# Patient Record
Sex: Male | Born: 1980 | Race: Black or African American | Hispanic: Yes | Marital: Single | Smoking: Current every day smoker
Health system: Southern US, Community
[De-identification: ages and names within clinical notes are randomized; demographics above are authoritative.]

## PROBLEM LIST (undated history)

## (undated) DIAGNOSIS — T7840XA Allergy, unspecified, initial encounter: Secondary | ICD-10-CM

## (undated) DIAGNOSIS — K219 Gastro-esophageal reflux disease without esophagitis: Secondary | ICD-10-CM

## (undated) HISTORY — DX: Gastro-esophageal reflux disease without esophagitis: K21.9

## (undated) HISTORY — DX: Allergy, unspecified, initial encounter: T78.40XA

---
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
## 2003-05-12 ENCOUNTER — Emergency Department (HOSPITAL_COMMUNITY): Admission: EM | Admit: 2003-05-12 | Discharge: 2003-05-12 | Payer: Self-pay | Admitting: Emergency Medicine

## 2003-11-09 ENCOUNTER — Emergency Department (HOSPITAL_COMMUNITY): Admission: AD | Admit: 2003-11-09 | Discharge: 2003-11-09 | Payer: Self-pay | Admitting: Internal Medicine

## 2007-03-03 ENCOUNTER — Emergency Department (HOSPITAL_COMMUNITY): Admission: EM | Admit: 2007-03-03 | Discharge: 2007-03-03 | Payer: Self-pay | Admitting: Family Medicine

## 2007-04-26 ENCOUNTER — Telehealth (INDEPENDENT_AMBULATORY_CARE_PROVIDER_SITE_OTHER): Payer: Self-pay | Admitting: *Deleted

## 2007-04-27 ENCOUNTER — Ambulatory Visit: Payer: Self-pay | Admitting: *Deleted

## 2007-04-27 ENCOUNTER — Encounter (INDEPENDENT_AMBULATORY_CARE_PROVIDER_SITE_OTHER): Payer: Self-pay | Admitting: Nurse Practitioner

## 2007-04-27 ENCOUNTER — Ambulatory Visit: Payer: Self-pay | Admitting: Internal Medicine

## 2007-04-27 DIAGNOSIS — K5900 Constipation, unspecified: Secondary | ICD-10-CM | POA: Insufficient documentation

## 2007-04-27 DIAGNOSIS — K219 Gastro-esophageal reflux disease without esophagitis: Secondary | ICD-10-CM

## 2007-04-27 DIAGNOSIS — J309 Allergic rhinitis, unspecified: Secondary | ICD-10-CM

## 2007-04-27 LAB — CONVERTED CEMR LAB
ALT: 11 units/L (ref 0–53)
AST: 17 units/L (ref 0–37)
Albumin: 5 g/dL (ref 3.5–5.2)
Basophils Absolute: 0 10*3/uL (ref 0.0–0.1)
Basophils Relative: 0 % (ref 0–1)
Calcium: 9.7 mg/dL (ref 8.4–10.5)
Chloride: 106 meq/L (ref 96–112)
MCHC: 33.6 g/dL (ref 30.0–36.0)
Monocytes Relative: 10 % (ref 3–11)
Neutro Abs: 2.1 10*3/uL (ref 1.7–7.7)
Neutrophils Relative %: 45 % (ref 43–77)
Potassium: 4.1 meq/L (ref 3.5–5.3)
RBC: 5.38 M/uL (ref 4.22–5.81)
RDW: 13.1 % (ref 11.5–14.0)
Sodium: 141 meq/L (ref 135–145)
Total Protein: 7.1 g/dL (ref 6.0–8.3)

## 2007-04-28 ENCOUNTER — Encounter (INDEPENDENT_AMBULATORY_CARE_PROVIDER_SITE_OTHER): Payer: Self-pay | Admitting: Nurse Practitioner

## 2007-10-09 ENCOUNTER — Encounter (INDEPENDENT_AMBULATORY_CARE_PROVIDER_SITE_OTHER): Payer: Self-pay | Admitting: Nurse Practitioner

## 2008-02-29 ENCOUNTER — Ambulatory Visit: Payer: Self-pay | Admitting: Internal Medicine

## 2008-05-07 ENCOUNTER — Ambulatory Visit: Payer: Self-pay | Admitting: Internal Medicine

## 2008-06-14 ENCOUNTER — Encounter (INDEPENDENT_AMBULATORY_CARE_PROVIDER_SITE_OTHER): Payer: Self-pay | Admitting: *Deleted

## 2008-09-05 ENCOUNTER — Emergency Department (HOSPITAL_COMMUNITY): Admission: EM | Admit: 2008-09-05 | Discharge: 2008-09-05 | Payer: Self-pay | Admitting: Family Medicine

## 2008-09-11 ENCOUNTER — Emergency Department (HOSPITAL_COMMUNITY): Admission: EM | Admit: 2008-09-11 | Discharge: 2008-09-11 | Payer: Self-pay | Admitting: Emergency Medicine

## 2013-08-25 ENCOUNTER — Emergency Department (HOSPITAL_COMMUNITY)
Admission: EM | Admit: 2013-08-25 | Discharge: 2013-08-25 | Disposition: A | Discharge status: Home / Self Care | Payer: Medicaid Other | Source: Home / Self Care | Attending: Emergency Medicine | Admitting: Emergency Medicine

## 2013-08-25 ENCOUNTER — Encounter (HOSPITAL_COMMUNITY): Payer: Self-pay | Admitting: Emergency Medicine

## 2013-08-25 DIAGNOSIS — J069 Acute upper respiratory infection, unspecified: Secondary | ICD-10-CM

## 2013-08-25 MED ORDER — GUAIFENESIN 100 MG/5ML PO LIQD: 100.0000 mg | ORAL | Status: DC | PRN

## 2013-08-25 MED ORDER — IPRATROPIUM BROMIDE 0.03 % NA SOLN: 2.0000 | Freq: Two times a day (BID) | NASAL | Status: DC

## 2013-08-25 NOTE — ED Provider Notes (Signed)
Medical screening examination/treatment/procedure(s) were performed by non-physician practitioner and as supervising physician I was immediately available for consultation/collaboration.  Anival Pasha, M.D.  Jatziri Goffredo C Corena Tilson, MD 08/25/13 2301 

## 2013-08-25 NOTE — ED Notes (Signed)
Pt c/o cold sxs onset yest w/sxs that include: cough, chest d/c/vomiting due to cough, runny nose, congestion Denies: f/v/n/d, SOB, wheezing .... Has not had any meds today He is alert w/no signs of acute distress.

## 2013-08-25 NOTE — ED Provider Notes (Signed)
CSN: [id number]     Arrival date & time 08/25/13  1426 History   First MD Initiated Contact with Patient 08/25/13 1710     Chief Complaint  Patient presents with  . URI   (Consider location/radiation/quality/duration/timing/severity/associated sxs/prior Treatment) Patient is a 32 y.o. male presenting with URI. The history is provided by the patient.  URI Presenting symptoms: congestion, cough and rhinorrhea   Severity:  Mild Onset quality:  Gradual Duration:  1 day Progression:  Unchanged Chronicity:  New Ineffective treatments:  None tried Associated symptoms: no arthralgias, no headaches, no myalgias, no neck pain, no sinus pain, no sneezing, no swollen glands and no wheezing     History reviewed. No pertinent past medical history. History reviewed. No pertinent past surgical history. No family history on file. History  Substance Use Topics  . Smoking status: Current Every Day Smoker -- 0.50 packs/day    Types: Cigarettes  . Smokeless tobacco: Not on file  . Alcohol Use: Not on file    Review of Systems  HENT: Positive for congestion and rhinorrhea. Negative for sneezing.   Respiratory: Positive for cough. Negative for wheezing.   Musculoskeletal: Negative for arthralgias, myalgias and neck pain.  Neurological: Negative for headaches.  All other systems reviewed and are negative.    Allergies  Review of patient's allergies indicates no known allergies.  Home Medications  No current outpatient prescriptions on file. BP 112/67  Pulse 70  Temp(Src) 98.5 F (36.9 C) (Oral)  Resp 18  SpO2 97% Physical Exam  Nursing note and vitals reviewed. Constitutional: He is oriented to person, place, and time. He appears well-developed and well-nourished. No distress.  HENT:  Head: Normocephalic and atraumatic.  Right Ear: Hearing, tympanic membrane, external ear and ear canal normal.  Left Ear: Hearing, tympanic membrane, external ear and ear canal normal.  Nose: Nose  normal.  Mouth/Throat: Uvula is midline, oropharynx is clear and moist and mucous membranes are normal.  Eyes: Conjunctivae are normal. Right eye exhibits no discharge. Left eye exhibits no discharge. No scleral icterus.  Neck: Normal range of motion. Neck supple.  Cardiovascular: Normal rate, regular rhythm and normal heart sounds.   Pulmonary/Chest: Effort normal and breath sounds normal.  Abdominal: Soft. Bowel sounds are normal.  Musculoskeletal: Normal range of motion.  Lymphadenopathy:    He has no cervical adenopathy.  Neurological: He is alert and oriented to person, place, and time.  Skin: Skin is warm and dry.  Psychiatric: He has a normal mood and affect. His behavior is normal.    ED Course  Procedures (including critical care time) Labs Review Labs Reviewed - No data to display Imaging Review No results found.  EKG Interpretation    Date/Time:    Ventricular Rate:    PR Interval:    QRS Duration:   QT Interval:    QTC Calculation:   R Axis:     Text Interpretation:              MDM   Very mild URI. Atrovent nasal spray for congestion and OTC robitussin for cough. PCP follow up prn.    Ardis Rowan, PA 08/25/13 [phone]

## 2013-12-07 ENCOUNTER — Encounter: Payer: Self-pay | Admitting: Family Medicine

## 2013-12-07 ENCOUNTER — Ambulatory Visit (INDEPENDENT_AMBULATORY_CARE_PROVIDER_SITE_OTHER): Payer: Medicaid Other | Admitting: Family Medicine

## 2013-12-07 VITALS — BP 107/66 | HR 61 | Temp 98.2°F | Wt 126.0 lb

## 2013-12-07 DIAGNOSIS — F411 Generalized anxiety disorder: Secondary | ICD-10-CM | POA: Insufficient documentation

## 2013-12-07 DIAGNOSIS — K5900 Constipation, unspecified: Secondary | ICD-10-CM

## 2013-12-07 LAB — CBC
HEMATOCRIT: 42.7 % (ref 39.0–52.0)
HEMOGLOBIN: 14.1 g/dL (ref 13.0–17.0)
MCH: 29 pg (ref 26.0–34.0)
MCHC: 33 g/dL (ref 30.0–36.0)
MCV: 87.7 fL (ref 78.0–100.0)
PLATELETS: 219 10*3/uL (ref 150–400)
RBC: 4.87 MIL/uL (ref 4.22–5.81)
RDW: 13.3 % (ref 11.5–15.5)
WBC: 4.7 10*3/uL (ref 4.0–10.5)

## 2013-12-07 NOTE — Assessment & Plan Note (Signed)
Most likely benign abdominal complaints - Patient concerned given family hx colon CA-Stage IV (Mother age 32)  Plan: 1. CBC - check for anemia, would prompt further testing with FOBT 2. Plan for colonoscopy at age 33 (given significant family hx) 3. Journal bowel habits or abdominal symptoms, discuss more at next apt 4. Inc fiber in diet

## 2013-12-07 NOTE — Patient Instructions (Signed)
Dear Damon Mckee, Thank you for coming in to clinic today. It was good to meet you!  1. I'm encouraged that you scheduled an appointment to be seen in our clinic. 2. Sorry to hear about your family members being diagnosed with cancer, I understand that this is a very stressful time for you. 3. I strongly encourage you to call Dr. Pascal LuxKane [phone]) to schedule an appointment to discuss your emotional / stress concerns, and we will work together to help you. 4. Your symptoms are not concerning for cancer. I understand your concern, and will check your blood count today, and I will mail you a letter with results.  Please schedule a follow-up appointment with me in 1 to 3 months, prefer to see you after you've been seen by Dr. Pascal LuxKane already  If you have any other questions or concerns, please feel free to call the clinic to contact me. You may also schedule an earlier appointment if necessary.  However, if your symptoms get significantly worse, please go to the Emergency Department to seek immediate medical attention.  Saralyn PilarAlexander Karamalegos, DO [facility]
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
## 2013-12-07 NOTE — Assessment & Plan Note (Signed)
Likely multifactorial etiology, likely significant life stressors / traumatic events, recent exacerbation by family member cancer diagnosis. Consider adjustment disorder, however with some chronic hx likely GAD +/- mood disorder  Plan: 1. Discussed plan for multi-facet treatment approach. Agreeable to no rx at this time 2. Refer to Dr. Pascal LuxKane for therapy and evaluation 3. Consider SSRI in future if persistent concomitant depressive features

## 2014-01-03 ENCOUNTER — Telehealth: Payer: Self-pay | Admitting: Family Medicine

## 2014-01-03 NOTE — Telephone Encounter (Signed)
Will FWD to PCP for lab review.  Radene OuKristen L Lindie Roberson, CMA

## 2014-01-03 NOTE — Telephone Encounter (Signed)
Pt called and would like someone to call him with his lab results. He also stated that he didn't receive a letter with the results either. He said that you can leave a message on his phone. jw

## 2014-01-04 NOTE — Telephone Encounter (Signed)
Reviewed patient's chart. Lab result of CBC is (normal, Hemoglobin 14.1 and Platelets 219). I attempted to call the number listed on chart [phone]), and it has an automated machine stating that this number does not accept incoming phone calls.  Could you attempt to call patient again, otherwise, if patient calls back, could you please provide him this information. I had encouraged him to follow-up with return OV in 1 to 3 months. We can discuss his results in more detail at that time. Otherwise, next week when [facility] I can mail a letter with results if we have been unable to contact him.  Thanks!  Saralyn PilarAlexander Kreg Earhart, DO [facility], PGY-1

## 2014-01-04 NOTE — Telephone Encounter (Signed)
Called again, Phone does not accept incoming calls.  Damon Mckee, CMA

## 2014-01-10 ENCOUNTER — Encounter: Payer: Self-pay | Admitting: Family Medicine

## 2014-05-01 ENCOUNTER — Emergency Department (HOSPITAL_COMMUNITY): Payer: Medicaid Other

## 2014-05-01 ENCOUNTER — Emergency Department (HOSPITAL_COMMUNITY)
Admission: EM | Admit: 2014-05-01 | Discharge: 2014-05-01 | Disposition: A | Discharge status: Home / Self Care | Payer: Medicaid Other | Attending: Emergency Medicine | Admitting: Emergency Medicine

## 2014-05-01 ENCOUNTER — Encounter (HOSPITAL_COMMUNITY): Payer: Self-pay | Admitting: Emergency Medicine

## 2014-05-01 DIAGNOSIS — J3489 Other specified disorders of nose and nasal sinuses: Secondary | ICD-10-CM | POA: Diagnosis not present

## 2014-05-01 DIAGNOSIS — F17200 Nicotine dependence, unspecified, uncomplicated: Secondary | ICD-10-CM | POA: Diagnosis not present

## 2014-05-01 DIAGNOSIS — R079 Chest pain, unspecified: Secondary | ICD-10-CM | POA: Insufficient documentation

## 2014-05-01 DIAGNOSIS — Z8719 Personal history of other diseases of the digestive system: Secondary | ICD-10-CM | POA: Diagnosis not present

## 2014-05-01 DIAGNOSIS — R05 Cough: Secondary | ICD-10-CM | POA: Insufficient documentation

## 2014-05-01 DIAGNOSIS — R059 Cough, unspecified: Secondary | ICD-10-CM | POA: Diagnosis not present

## 2014-05-01 DIAGNOSIS — R0789 Other chest pain: Secondary | ICD-10-CM | POA: Insufficient documentation

## 2014-05-01 LAB — CBC
HEMATOCRIT: 43 % (ref 39.0–52.0)
HEMOGLOBIN: 14.9 g/dL (ref 13.0–17.0)
MCH: 30.5 pg (ref 26.0–34.0)
MCHC: 34.7 g/dL (ref 30.0–36.0)
MCV: 88.1 fL (ref 78.0–100.0)
Platelets: 184 10*3/uL (ref 150–400)
RBC: 4.88 MIL/uL (ref 4.22–5.81)
RDW: 12.8 % (ref 11.5–15.5)
WBC: 4 10*3/uL (ref 4.0–10.5)

## 2014-05-01 LAB — BASIC METABOLIC PANEL
Anion gap: 11 (ref 5–15)
BUN: 14 mg/dL (ref 6–23)
CO2: 27 mEq/L (ref 19–32)
CREATININE: 0.84 mg/dL (ref 0.50–1.35)
Calcium: 9.4 mg/dL (ref 8.4–10.5)
Chloride: 107 mEq/L (ref 96–112)
GFR calc Af Amer: >90 mL/min (ref >90)
GLUCOSE: 85 mg/dL (ref 70–99)
POTASSIUM: 3.9 meq/L (ref 3.7–5.3)
Sodium: 145 mEq/L (ref 137–147)

## 2014-05-01 LAB — I-STAT TROPONIN, ED: Troponin i, poc: 0 ng/mL (ref 0.00–0.08)

## 2014-05-01 MED ORDER — NAPROXEN 500 MG PO TABS: 500.0000 mg | ORAL_TABLET | Freq: Two times a day (BID) | ORAL | Status: DC

## 2014-05-01 NOTE — ED Provider Notes (Signed)
CSN: [id number]     Arrival date & time 05/01/14  1417 History   First MD Initiated Contact with Patient 05/01/14 1514     Chief Complaint  Patient presents with  . Chest Pain     (Consider location/radiation/quality/duration/timing/severity/associated sxs/prior Treatment) HPI Comments: Patient is a 33 year old male past medical history significant for GERD, tobacco abuse presenting to the emergency department for 2 days of intermittent episodes of central nonradiating chest sharpness. Each episodes lasts a varying length of time. Alleviating factors: Naproxen. Aggravating factors: stretching, movement, palpation. Medications tried prior to arrival: Naproxen. Patient states he is pain free currently. Endorses associated rhinorrhea and non-productive cough. Denies SOB, nausea, vomiting, abdominal pain. PERC negative. No early familial cardiac history.     Patient is a 33 y.o. male presenting with chest pain.  Chest Pain Associated symptoms: cough   Associated symptoms: no shortness of breath     Past Medical History  Diagnosis Date  . Allergy   . GERD (gastroesophageal reflux disease)    No past surgical history on file. Family History  Problem Relation Age of Onset  . Cancer Mother 32    Colon Cancer (Stage IV)  . Diabetes Mother   . Hypertension Mother   . Cancer Father 68    Prostate Cancer  . Diabetes Father   . Hypertension Father   . Alcohol abuse Father    History  Substance Use Topics  . Smoking status: Current Every Day Smoker -- 0.50 packs/day    Types: Cigarettes  . Smokeless tobacco: Not on file  . Alcohol Use: Yes     Comment: every other week    Review of Systems  HENT: Positive for rhinorrhea.   Respiratory: Positive for cough. Negative for shortness of breath.   Cardiovascular: Positive for chest pain. Negative for leg swelling.  All other systems reviewed and are negative.     Allergies  Review of patient's allergies indicates no known  allergies.  Home Medications   Prior to Admission medications   Medication Sig Start Date End Date Taking? Authorizing Provider  guaiFENesin (ROBITUSSIN) 100 MG/5ML liquid Take 5-10 mLs (100-200 mg total) by mouth every 4 (four) hours as needed for cough. 08/25/13   Mathis Fare Presson, PA  ipratropium (ATROVENT) 0.03 % nasal spray Place 2 sprays into both nostrils every 12 (twelve) hours. 08/25/13   Mathis Fare Presson, PA  naproxen (NAPROSYN) 500 MG tablet Take 1 tablet (500 mg total) by mouth 2 (two) times daily with a meal. 05/01/14   Roux Brandy L Faryal Marxen, PA-C   BP 110/63  Pulse 71  Temp(Src) 98 F (36.7 C) (Oral)  Resp 16  Ht  (1.626 m)  Wt 124 lb (56.246 kg)  BMI 21.27 kg/m2  SpO2 97% Physical Exam  Nursing note and vitals reviewed. Constitutional: He is oriented to person, place, and time. He appears well-developed and well-nourished. No distress.  HENT:  Head: Normocephalic and atraumatic.  Right Ear: External ear normal.  Left Ear: External ear normal.  Nose: Nose normal.  Mouth/Throat: Oropharynx is clear and moist. No oropharyngeal exudate.  Eyes: Conjunctivae are normal.  Neck: Neck supple.  Cardiovascular: Normal rate, regular rhythm and normal heart sounds.   Pulmonary/Chest: Effort normal and breath sounds normal. No respiratory distress. He exhibits no tenderness.  Abdominal: Soft. There is no tenderness.  Musculoskeletal: He exhibits no edema.  Lymphadenopathy:    He has no cervical adenopathy.  Neurological: He is alert and oriented to person,  place, and time.  Skin: Skin is warm and dry. He is not diaphoretic.    ED Course  Procedures (including critical care time) Medications - No data to display  Labs Review Labs Reviewed  CBC  BASIC METABOLIC PANEL  Rosezena Sensor, ED    Imaging Review Dg Chest 2 View  05/01/2014   CLINICAL DATA:  Chest pain  EXAM: CHEST  2 VIEW  COMPARISON:  None.  FINDINGS: Normal heart size and mediastinal  contours. No acute infiltrate or edema. No effusion or pneumothorax. No acute osseous findings.  IMPRESSION: No active cardiopulmonary disease.   Electronically Signed   By: Tiburcio Pea M.D.   On: 05/01/2014 15:05     EKG Interpretation   Date/Time:  Wednesday May 01 2014 14:21:47 EDT Ventricular Rate:  60 PR Interval:  146 QRS Duration: 106 QT Interval:  378 QTC Calculation: 378 R Axis:   178 Text Interpretation:  Normal sinus rhythm Right axis deviation Incomplete  right bundle branch block Right ventricular hypertrophy Abnormal ECG No  old tracing to compare Confirmed by MILLER  MD, BRIAN ([id number]) on 05/01/2014  3:47:18 PM      MDM   Final diagnoses:  Chest pain, atypical    Filed Vitals:   05/01/14 1643  BP:   Pulse: 71  Temp:   Resp: 16   Afebrile, NAD, non-toxic appearing, AAOx4. Patient is to be discharged with recommendation to follow up with PCP in regards to today's hospital visit. Chest pain is not likely of cardiac or pulmonary etiology d/t presentation, perc negative, VSS, no tracheal deviation, no JVD or new murmur, RRR, breath sounds equal bilaterally, EKG without acute abnormalities, negative troponin, and negative CXR. Pt has been advised to return to the ED is CP becomes exertional, associated with diaphoresis or nausea, radiates to left jaw/arm, worsens or becomes concerning in any way. Pt appears reliable for follow up and is agreeable to discharge. Patient is stable at time of discharge.       Jeannetta Ellis, PA-C 05/01/14 1917

## 2014-05-01 NOTE — ED Notes (Signed)
Pt reports 7/10 intermittent central chest "sharpness" x 2 days. Denies SOB, N/V or diaphoresis. Pt in NAD. AO x4.

## 2014-05-01 NOTE — Discharge Instructions (Signed)
Please follow up with your primary care physician in 1-2 days. If you do not have one please call the [facility] number listed above. Please take Naproxen as prescribed. Please read all discharge instructions and return precautions.    Chest Pain (Nonspecific) It is often hard to give a specific diagnosis for the cause of chest pain. There is always a chance that your pain could be related to something serious, such as a heart attack or a blood clot in the lungs. You need to follow up with your health care provider for further evaluation. CAUSES   Heartburn.  Pneumonia or bronchitis.  Anxiety or stress.  Inflammation around your heart (pericarditis) or lung (pleuritis or pleurisy).  A blood clot in the lung.  A collapsed lung (pneumothorax). It can develop suddenly on its own (spontaneous pneumothorax) or from trauma to the chest.  Shingles infection (herpes zoster virus). The chest wall is composed of bones, muscles, and cartilage. Any of these can be the source of the pain.  The bones can be bruised by injury.  The muscles or cartilage can be strained by coughing or overwork.  The cartilage can be affected by inflammation and become sore (costochondritis). DIAGNOSIS  Lab tests or other studies may be needed to find the cause of your pain. Your health care provider may have you take a test called an ambulatory electrocardiogram (ECG). An ECG records your heartbeat patterns over a 24-hour period. You may also have other tests, such as:  Transthoracic echocardiogram (TTE). During echocardiography, sound waves are used to evaluate how blood flows through your heart.  Transesophageal echocardiogram (TEE).  Cardiac monitoring. This allows your health care provider to monitor your heart rate and rhythm in real time.  Holter monitor. This is a portable device that records your heartbeat and can help diagnose heart arrhythmias. It allows your health care provider to  track your heart activity for several days, if needed.  Stress tests by exercise or by giving medicine that makes the heart beat faster. TREATMENT   Treatment depends on what may be causing your chest pain. Treatment may include:  Acid blockers for heartburn.  Anti-inflammatory medicine.  Pain medicine for inflammatory conditions.  Antibiotics if an infection is present.  You may be advised to change lifestyle habits. This includes stopping smoking and avoiding alcohol, caffeine, and chocolate.  You may be advised to keep your head raised (elevated) when sleeping. This reduces the chance of acid going backward from your stomach into your esophagus. Most of the time, nonspecific chest pain will improve within 2-3 days with rest and mild pain medicine.  HOME CARE INSTRUCTIONS   If antibiotics were prescribed, take them as directed. Finish them even if you start to feel better.  For the next few days, avoid physical activities that bring on chest pain. Continue physical activities as directed.  Do not use any tobacco products, including cigarettes, chewing tobacco, or electronic cigarettes.  Avoid drinking alcohol.  Only take medicine as directed by your health care provider.  Follow your health care provider's suggestions for further testing if your chest pain does not go away.  Keep any follow-up appointments you made. If you do not go to an appointment, you could develop lasting (chronic) problems with pain. If there is any problem keeping an appointment, call to reschedule. SEEK MEDICAL CARE IF:   Your chest pain does not go away, even after treatment.  You have a rash with blisters on your chest.  You have a fever. SEEK IMMEDIATE MEDICAL CARE IF:   You have increased chest pain or pain that spreads to your arm, neck, jaw, back, or abdomen.  You have shortness of breath.  You have an increasing cough, or you cough up blood.  You have severe back or abdominal  pain.  You feel nauseous or vomit.  You have severe weakness.  You faint.  You have chills. This is an emergency. Do not wait to see if the pain will go away. Get medical help at once. Call your local emergency services (911 in [place] Do not drive yourself to the hospital. MAKE SURE YOU:   Understand these instructions.  Will watch your condition.  Will get help right away if you are not doing well or get worse. Document Released: 05/26/2005 Document Revised: 08/21/2013 Document Reviewed: 03/21/2008 [facility] Patient Information 2015 [place], [place]. This information is not intended to replace advice given to you by your health care provider. Make sure you discuss any questions you have with your health care provider.

## 2014-05-02 NOTE — ED Provider Notes (Signed)
Medical screening examination/treatment/procedure(s) were performed by non-physician practitioner and as supervising physician I was immediately available for consultation/collaboration.    Vida Roller, MD 05/02/14 [phone]

## 2014-11-04 ENCOUNTER — Encounter: Payer: Medicaid Other | Admitting: Family Medicine

## 2014-12-03 ENCOUNTER — Encounter: Payer: Self-pay | Admitting: Family Medicine

## 2014-12-03 ENCOUNTER — Ambulatory Visit (INDEPENDENT_AMBULATORY_CARE_PROVIDER_SITE_OTHER): Payer: Medicaid Other | Admitting: Family Medicine

## 2014-12-03 VITALS — BP 134/79 | HR 71 | Temp 98.2°F | Ht 64.0 in | Wt 135.0 lb

## 2014-12-03 DIAGNOSIS — Z720 Tobacco use: Secondary | ICD-10-CM

## 2014-12-03 DIAGNOSIS — Z23 Encounter for immunization: Secondary | ICD-10-CM | POA: Diagnosis not present

## 2014-12-03 DIAGNOSIS — J309 Allergic rhinitis, unspecified: Secondary | ICD-10-CM

## 2014-12-03 DIAGNOSIS — Z13220 Encounter for screening for lipoid disorders: Secondary | ICD-10-CM | POA: Diagnosis not present

## 2014-12-03 DIAGNOSIS — Z114 Encounter for screening for human immunodeficiency virus [HIV]: Secondary | ICD-10-CM

## 2014-12-03 DIAGNOSIS — F411 Generalized anxiety disorder: Secondary | ICD-10-CM | POA: Diagnosis present

## 2014-12-03 DIAGNOSIS — Z0000 Encounter for general adult medical examination without abnormal findings: Secondary | ICD-10-CM

## 2014-12-03 DIAGNOSIS — Z113 Encounter for screening for infections with a predominantly sexual mode of transmission: Secondary | ICD-10-CM | POA: Insufficient documentation

## 2014-12-03 NOTE — Assessment & Plan Note (Addendum)
Stable, without improvement or worsening. Suspect multifactorial with stressors, possible underlying mood disorder. Concerns with friend/family member diagnosis with cancer - PHQ-9: score 6, functional, remains employed, engaged to fiance - Previously not interested in psych meds / counseling, now may be interested - Occasional EtOH, chronic tobacco, denies other substances - No SI / HI  Plan: 1. [facility] / therapy, not interested in Psychiatry at this time. Preference to avoid medical therapy 2. Encourage healthy lifestyle, diet, exercise, sleep hygiene to improve mood, reduce stress 3. Reassurance regarding concerns about cancer dx - exam reassuring, no symptoms, emphasized plan for early colon CA screening age 34, and would recommend early prostate screening between age 33-50 as well 4. Smoking cessation 5. RTC PRN

## 2014-12-03 NOTE — Assessment & Plan Note (Signed)
Chronic active smoker - Not ready to quit  Plan: 1. Smoking cessation counseling provided

## 2014-12-03 NOTE — Assessment & Plan Note (Signed)
-   B/l allergic shiners and some allergic symptoms - Start Claritin OTC 10mg  daily trial

## 2014-12-03 NOTE — Assessment & Plan Note (Signed)
-   Ordered future fasting lipid panel, HIV screening - TDap given today - CA Screening, provided reassurance regarding concerns about cancer dx - exam reassuring, no symptoms, emphasized plan for early colon CA screening age 34, and would recommend early prostate screening between age 33-50 as well

## 2014-12-03 NOTE — Progress Notes (Signed)
   Subjective:    Patient ID: Damon Mckee, male    DOB: 11/24/1980, 34 y.o.   MRN: [id number]  Patient presents for annual physical exam.  HPI  ANXIETY / MOOD DISORDER: - Chronic h/o anxiety with multiple life stressors - Today reports overall mood is "decent", has some symptoms with "feeling down" or "little interest" not everyday, and seems to come and go based on current stressors. Reports he is "not very social" and has a hard time getting along with some people. Continues to function at his same job in [employer]. Remains engaged to fiance. - Initial PHQ-2 positive, PHQ-9 testing score 6 (see documentation in Epic) - No prior diagnosis of depression. No prior psychiatric medications. - Previously referred to Dr. Pascal [facility]Kane at [facility], however he did not follow-up and schedule appointment, did not think he needed counseling last year. Now states he may be interested in this option, but is not interested in medicines for mood or anxiety - Additionally, admits to concerns with friend diagnosed with "rare form of cancer", states parents dx with cancer as well (Fam hx mother colon CA, father prostate CA), he is concerned about cancer and other medical diagnoses. - Admits to some chronic decreased energy and motivation, poor sleep - Denies suicidal or homicidal ideation - Denies any anger concerns or emotional lability, hyperactivity, inappropriate behavior, fevers/chills, unintentional weight loss, nightsweats  RIGHT FOREARM / THIGH SORENESS: - Reports that he often gets intermittent episodes of muscle soreness mostly in Right forearm, and sometimes in Right lower thigh behind his knee. 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 No specific injuries or trauma. - Does not take any medicines (Tylenol, NSAIDs), no topical  therapy - Denies any numbness, weakness, tingling, swelling  TOBACCO ABUSE: - Active smoker, chronic 0.5 ppd > 20 years - Previously quit briefly. Not currently ready to quit. States he would be able to if he wanted. - No medicines or NRT tried  I have reviewed and updated the following as appropriate: allergies and current medications  Social Hx: - Lives with fiance, with her 2 daughters and their 1 son - Current smoker 1/2 ppd (since 1994) - Occasional EtOH every other week (3-4 drinks a time)  Review of Systems  See above HPI    Objective:   Physical Exam  BP 134/79 mmHg  Pulse 71  Temp(Src) 98.2 F (36.8 C) (Oral)  Ht 5\' 4"  (1.626 m)  Wt 135 lb (61.236 kg)  BMI 23.16 kg/m2  Gen - thin, well-developed, well-appearing, cooperative, NAD HEENT - NCAT, PERRL, allergic shiners with mild puffiness under each eye, b/l nares patent, MMM Neck - supple, non-tender, no LAD or thyromegaly Heart - RRR, no murmurs heard Lungs - CTAB, no wheezing, crackles, or rhonchi. Normal work of breathing. Skin - warm, dry, no rashes Neuro - awake, alert, grossly non-focal, intact muscle strength 5/5 b/l grip, hip flex and ext, knee flex, gait normal Psych - mild blunted affect, normal thought content and speech, appropriate behavior, good eye contact     Assessment & Plan:   See specific A&P problem list for details.

## 2014-12-03 NOTE — Assessment & Plan Note (Signed)
Return for scheduled Lab Only visit, screening fasting lipid panel

## 2014-12-03 NOTE — Patient Instructions (Signed)
Dear Damon Mckee, Thank you for coming in to clinic today. It was good to meet you!  1. For your muscle soreness - it sounds like muscle strain from lifting at work. If worsens or flare can use anti-inflammatory (Ibuprofen or Aleve), topical icy-hot, stretching helps prevent. 2. For Anxiety / Stress - referral to [facility] for counseling / therapy - they will call you with an appointment. Please call us back if you don't hear anything over next 1-2 months 3. Tetanus shot today  Please schedule "Lab Only" Appointment (8 or 9 am) to get Fasting blood work (don't eat after midnight night before, no eating / drinking in morning), check Cholesterol and HIV  Please schedule a follow-up appointment with me as needed to discuss Anxiety / Stress, otherwise return in 1 year for physical.  If you have any other questions or concerns, please feel free to call the clinic to contact me. You may also schedule an earlier appointment if necessary.  However, if your symptoms get significantly worse, please go to the Emergency Department to seek immediate medical attention.  Saralyn PilarAlexander Ryer Asato, DO [facility]

## 2014-12-10 ENCOUNTER — Other Ambulatory Visit: Payer: Medicaid Other

## 2014-12-11 NOTE — Progress Notes (Signed)
I was the preceptor for this visit. 

## 2014-12-13 ENCOUNTER — Ambulatory Visit: Payer: Medicaid Other | Admitting: Family Medicine

## 2015-09-26 ENCOUNTER — Other Ambulatory Visit (HOSPITAL_COMMUNITY)
Admission: RE | Admit: 2015-09-26 | Discharge: 2015-09-26 | Disposition: A | Discharge status: Home / Self Care | Payer: Medicaid Other | Source: Ambulatory Visit | Attending: Family Medicine | Admitting: Family Medicine

## 2015-09-26 ENCOUNTER — Ambulatory Visit (INDEPENDENT_AMBULATORY_CARE_PROVIDER_SITE_OTHER): Payer: Medicaid Other | Admitting: Family Medicine

## 2015-09-26 VITALS — BP 135/84 | HR 58 | Temp 97.7°F | Wt 158.5 lb

## 2015-09-26 DIAGNOSIS — Z113 Encounter for screening for infections with a predominantly sexual mode of transmission: Secondary | ICD-10-CM | POA: Insufficient documentation

## 2015-09-26 DIAGNOSIS — Z202 Contact with and (suspected) exposure to infections with a predominantly sexual mode of transmission: Secondary | ICD-10-CM | POA: Insufficient documentation

## 2015-09-26 DIAGNOSIS — K219 Gastro-esophageal reflux disease without esophagitis: Secondary | ICD-10-CM

## 2015-09-26 MED ORDER — METRONIDAZOLE 250 MG PO TABS
2000.0000 mg | ORAL_TABLET | Freq: Once | ORAL | Status: AC
  Administered 2015-09-26: 2000 mg via ORAL

## 2015-09-26 MED ORDER — RANITIDINE HCL 150 MG PO TABS: 150.0000 mg | ORAL_TABLET | Freq: Two times a day (BID) | ORAL | Status: DC

## 2015-09-26 NOTE — Assessment & Plan Note (Signed)
Asymptomatic today. Irritation in groin likely related to skin fold rubbing against each other - no active lesions to culture. Will treat with  of flagyl today for trichomonas exposure. Will check HIV, RPR, and HSV antibodies. Will check urine for GC and chlamydia. Safe sex practices discussed. Follow up as needed.

## 2015-09-26 NOTE — Patient Instructions (Signed)
We will treat you for trichomoniasis today. Please do not have intercourse for 7 days after taking this medication.  We will check your blood for HIV, syphilis, and herpes. We should have results next week.  We will check your urine for gonorrhea and chlamydia. We should have these results next week.  We will give you acid reflux medication. If you are still having issues, please come back to see your regular doctor.  Take care,  Dr Jimmey Ralph

## 2015-09-26 NOTE — Addendum Note (Signed)
Addended by: Jennette Bill on: 09/26/2015 12:09 PM   Modules accepted: Orders

## 2015-09-26 NOTE — Assessment & Plan Note (Signed)
Will restart ranitidine today.

## 2015-09-26 NOTE — Progress Notes (Signed)
    Subjective:  Damon Mckee is a 35 y.o. male who presents to the [facility] today for same day appointment with a chief complaint of STD exposure.   HPI:  STD Exposure Patient reports that his current partner was diagnosed with trichomonas and herpes a few days ago. He has not currently received any treatment. Last sexually active with that partner about a week ago. No other partners. Patient has noticed increased raw skin in his groin, but no open sores. No dysuria or penile discharge. No fevers or chills. Took some penicillin he had left over at home this morning.   GERD Patient also complains of GERD symptoms. Was previously on a medication that helped, but does not remember the name. No nausea or vomiting. No weight loss.   ROS: Per HPI  PMH:  The following were reviewed and entered/updated in epic: Past Medical History  Diagnosis Date  . Allergy   . GERD (gastroesophageal reflux disease)    Patient Active Problem List   Diagnosis Date Noted  . STD exposure 09/26/2015  . Tobacco abuse 12/03/2014  . Screening for cholesterol level 12/03/2014  . Screening for HIV without presence of risk factors 12/03/2014  . Well adult exam 12/03/2014  . Anxiety state 12/07/2013  . Allergic rhinitis 04/27/2007  . GERD 04/27/2007  . CONSTIPATION 04/27/2007   No past surgical history on file.   Objective:  Physical Exam: BP 135/84 mmHg  Pulse 58  Temp(Src) 97.7 F (36.5 C) (Oral)  Wt 158 lb 8 oz (71.895 kg)  Gen: NAD, resting comfortably CV: RRR with no murmurs appreciated Pulm: NWOB, CTAB with no crackles, wheezes, or rhonchi GI: Normal bowel sounds present. Soft, Nontender, Nondistended. GU: Normal male genitalia. No open lesions. Inguinal area with slight erythema but no vesicles or areas of skin breakdown.  Skin: warm, dry  Assessment/Plan:  STD exposure Asymptomatic today. Irritation in groin likely related to skin fold rubbing against each other - no active lesions to  culture. Will treat with  of flagyl today for trichomonas exposure. Will check HIV, RPR, and HSV antibodies. Will check urine for GC and chlamydia. Safe sex practices discussed. Follow up as needed.   GERD Will restart ranitidine today.   Katina Degree. Jimmey Ralph, MD [facility] Resident PGY-2 09/26/2015 11:49 AM

## 2015-09-27 LAB — RPR

## 2015-09-27 LAB — HIV ANTIBODY (ROUTINE TESTING W REFLEX): HIV 1&2 Ab, 4th Generation: NONREACTIVE

## 2015-09-29 ENCOUNTER — Encounter: Payer: Self-pay | Admitting: Family Medicine

## 2015-09-29 LAB — URINE CYTOLOGY ANCILLARY ONLY
CHLAMYDIA, DNA PROBE: NEGATIVE
NEISSERIA GONORRHEA: NEGATIVE

## 2015-09-29 LAB — HSV 2 ANTIBODY, IGG

## 2015-09-29 LAB — HSV 1 ANTIBODY, IGG: HSV 1 GLYCOPROTEIN G AB, IGG: 13.06 IV — AB

## 2015-09-30 ENCOUNTER — Telehealth: Payer: Self-pay | Admitting: Family Medicine

## 2015-09-30 NOTE — Telephone Encounter (Signed)
Would like results

## 2015-10-01 NOTE — Telephone Encounter (Signed)
Pt is calling to get his lab results. jw

## 2015-10-01 NOTE — Telephone Encounter (Signed)
Patient last seen by Dr Jimmey Ralph on 09/26/15, will forward message to him to follow-up with patient on his STD test results.  Saralyn Pilar, DO [facility], PGY-3

## 2015-10-01 NOTE — Telephone Encounter (Signed)
Called patient to discuss results. 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 Instructed him to follow up with [place] if he has any genital symptoms.   Katina Degree. Jimmey Ralph, MD [facility] Resident PGY-2 10/01/2015 11:59 AM

## 2015-10-06 ENCOUNTER — Telehealth: Payer: Self-pay | Admitting: Family Medicine

## 2015-10-06 ENCOUNTER — Emergency Department (INDEPENDENT_AMBULATORY_CARE_PROVIDER_SITE_OTHER)
Admission: EM | Admit: 2015-10-06 | Discharge: 2015-10-06 | Disposition: A | Discharge status: Home / Self Care | Payer: Self-pay | Source: Home / Self Care | Attending: Family Medicine | Admitting: Family Medicine

## 2015-10-06 ENCOUNTER — Emergency Department (INDEPENDENT_AMBULATORY_CARE_PROVIDER_SITE_OTHER): Payer: Self-pay

## 2015-10-06 ENCOUNTER — Encounter (HOSPITAL_COMMUNITY): Payer: Self-pay | Admitting: Emergency Medicine

## 2015-10-06 DIAGNOSIS — M940 Chondrocostal junction syndrome [Tietze]: Secondary | ICD-10-CM

## 2015-10-06 MED ORDER — NAPROXEN 500 MG PO TABS: 500.0000 mg | ORAL_TABLET | Freq: Two times a day (BID) | ORAL | Status: DC

## 2015-10-06 NOTE — ED Provider Notes (Signed)
CSN: [id number]     Arrival date & time 10/06/15  1304 History   First MD Initiated Contact with Patient 10/06/15 1331     Chief Complaint  Patient presents with  . Cough  . Diarrhea  . Pleurisy   (Consider location/radiation/quality/duration/timing/severity/associated sxs/prior Treatment) HPI History obtained from patient:   LOCATION:chest right side SEVERITY: DURATION: 5 days CONTEXT: recent URI QUALITY: MODIFYING FACTORS: OTC meds without relief ASSOCIATED SYMPTOMS:cough with sputum green TIMING: episodic OCCUPATION: painter  Past Medical History  Diagnosis Date  . Allergy   . GERD (gastroesophageal reflux disease)    History reviewed. No pertinent past surgical history. Family History  Problem Relation Age of Onset  . Cancer Mother 72    Colon Cancer (Stage IV)  . Diabetes Mother   . Hypertension Mother   . Cancer Father 69    Prostate Cancer  . Diabetes Father   . Hypertension Father   . Alcohol abuse Father    Social History  Substance Use Topics  . Smoking status: Current Every Day Smoker -- 0.50 packs/day    Types: Cigarettes  . Smokeless tobacco: None  . Alcohol Use: Yes     Comment: every other week    Review of Systems ROS +'ve reperoducible right sided chest pain  Denies: HEADACHE, NAUSEA, ABDOMINAL PAIN, CHEST PAIN, CONGESTION, DYSURIA, SHORTNESS OF BREATH  Allergies  Review of patient's allergies indicates no known allergies.  Home Medications   Prior to Admission medications   Medication Sig Start Date End Date Taking? Authorizing Provider  ranitidine (ZANTAC) 150 MG tablet Take 1 tablet (150 mg total) by mouth 2 (two) times daily. 09/26/15   Ardith Dark, MD   Meds Ordered and Administered this Visit  Medications - No data to display  BP 138/89 mmHg  Pulse 59  Temp(Src) 98.6 F (37 C) (Oral)  Resp 12  SpO2 100% No data found.   Physical Exam  Constitutional: He appears well-developed and well-nourished.  Cardiovascular:  Normal rate.   Pulmonary/Chest: Effort normal and breath sounds normal. He exhibits tenderness. Right breast exhibits tenderness.    Nursing note and vitals reviewed.   ED Course  Procedures (including critical care time)  Labs Review Labs Reviewed - No data to display  Imaging Review Dg Chest 2 View  10/06/2015  CLINICAL DATA:  Right-sided chest pain with cough for 1 week. Smoker. EXAM: CHEST  2 VIEW COMPARISON:  Radiographs 05/01/2014. FINDINGS: The heart size and mediastinal contours are normal. The lungs are clear. There is no pleural effusion or pneumothorax. No acute osseous findings are identified. IMPRESSION: Stable chest.  No active cardiopulmonary process. Electronically Signed   By: Carey Bullocks M.D.   On: 10/06/2015 14:20     Visual Acuity Review  Right Eye Distance:   Left Eye Distance:   Bilateral Distance:    Right Eye Near:   Left Eye Near:    Bilateral Near:         MDM   1. Costochondritis, acute    Patient is advised to continue home symptomatic treatment. Prescription for naproxen sent pharmacy patient has indicated. Patient is advised that if there are new or worsening symptoms or attend the emergency department, or contact primary care provider. Instructions of care provided discharged home in stable condition. Return to work/school note provided.  THIS NOTE WAS GENERATED USING A VOICE RECOGNITION SOFTWARE PROGRAM. ALL REASONABLE EFFORTS  WERE MADE TO PROOFREAD THIS DOCUMENT FOR ACCURACY.     Tharon Aquas,  PA 10/06/15 1450

## 2015-10-06 NOTE — Discharge Instructions (Signed)
Costochondritis Costochondritis is a condition in which the tissue (cartilage) that connects your ribs with your breastbone (sternum) becomes irritated. It causes pain in the chest and rib area. It usually goes away on its own over time. HOME CARE  Avoid activities that wear you out.  Do not strain your ribs. Avoid activities that use your:  Chest.  Belly.  Side muscles.  Put ice on the area for the first 2 days after the pain starts.  Put ice in a plastic bag.  Place a towel between your skin and the bag.  Leave the ice on for 20 minutes, 2-3 times a day.  Only take medicine as told by your doctor. GET HELP IF:  You have redness or puffiness (swelling) in the rib area.  Your pain does not go away with rest or medicine. GET HELP RIGHT AWAY IF:   Your pain gets worse.  You are very uncomfortable.  You have trouble breathing.  You cough up blood.  You start sweating or throwing up (vomiting).  You have a fever or lasting symptoms for more than 2-3 days.  You have a fever and your symptoms suddenly get worse. MAKE SURE YOU:   Understand these instructions.  Will watch your condition.  Will get help right away if you are not doing well or get worse.   This information is not intended to replace advice given to you by your health care provider. Make sure you discuss any questions you have with your health care provider.   Document Released: 02/02/2008 Document Revised: 04/18/2013 Document Reviewed: 03/20/2013 Elsevier Interactive Patient Education 2016 Elsevier Inc.  Chest Wall Pain Chest wall pain is pain in or around the bones and muscles of your chest. Sometimes, an injury causes this pain. Sometimes, the cause may not be known. This pain may take several weeks or longer to get better. HOME CARE Pay attention to any changes in your symptoms. Take these actions to help with your pain:  Rest as told by your doctor.  Avoid activities that cause pain. Try not  to use your chest, belly (abdominal), or side muscles to lift heavy things.  If directed, apply ice to the painful area:  Put ice in a plastic bag.  Place a towel between your skin and the bag.  Leave the ice on for 20 minutes, 2-3 times per day.  Take over-the-counter and prescription medicines only as told by your doctor.  Do not use tobacco products, including cigarettes, chewing tobacco, and e-cigarettes. If you need help quitting, ask your doctor.  Keep all follow-up visits as told by your doctor. This is important. GET HELP IF:  You have a fever.  Your chest pain gets worse.  You have new symptoms. GET HELP RIGHT AWAY IF:  You feel sick to your stomach (nauseous) or you throw up (vomit).  You feel sweaty or light-headed.  You have a cough with phlegm (sputum) or you cough up blood.  You are short of breath.   This information is not intended to replace advice given to you by your health care provider. Make sure you discuss any questions you have with your health care provider.   Document Released: 02/02/2008 Document Revised: 05/07/2015 Document Reviewed: 11/11/2014 Elsevier Interactive Patient Education [employer]

## 2015-10-06 NOTE — Telephone Encounter (Signed)
Returned patient's called. Patient asked about positive HSV-1 result. Again told patient that this is the virus that causes cold sores and in some cases can cause genital symptoms. Patient has not had any genital symptoms, though does have a history of cold sores. Patient voiced understanding and had no further symptoms.  Katina Degree. Jimmey Ralph, MD [facility] Resident PGY-2 10/06/2015 4:11 PM

## 2015-10-06 NOTE — Telephone Encounter (Signed)
Seen Dr. Jimmey Ralph on 09/26/15 and would like more clarification on lab results (HSV 1) . Please advise.

## 2015-10-06 NOTE — Telephone Encounter (Signed)
Will forward to MD to clarify results.  Jazmin Hartsell,CMA

## 2015-10-06 NOTE — ED Notes (Signed)
The patient presented to the [facility] with a complaint of a cough, chest wall pain and a slight amount of diarrhea for 5 days.

## 2015-10-08 ENCOUNTER — Encounter (HOSPITAL_COMMUNITY): Payer: Self-pay | Admitting: Emergency Medicine

## 2015-10-08 ENCOUNTER — Emergency Department (HOSPITAL_COMMUNITY)
Admission: EM | Admit: 2015-10-08 | Discharge: 2015-10-08 | Disposition: A | Discharge status: Home / Self Care | Payer: Self-pay | Attending: Emergency Medicine | Admitting: Emergency Medicine

## 2015-10-08 ENCOUNTER — Emergency Department (HOSPITAL_COMMUNITY): Payer: Self-pay

## 2015-10-08 DIAGNOSIS — K219 Gastro-esophageal reflux disease without esophagitis: Secondary | ICD-10-CM | POA: Insufficient documentation

## 2015-10-08 DIAGNOSIS — M940 Chondrocostal junction syndrome [Tietze]: Secondary | ICD-10-CM | POA: Insufficient documentation

## 2015-10-08 DIAGNOSIS — Z791 Long term (current) use of non-steroidal anti-inflammatories (NSAID): Secondary | ICD-10-CM | POA: Insufficient documentation

## 2015-10-08 DIAGNOSIS — R079 Chest pain, unspecified: Secondary | ICD-10-CM

## 2015-10-08 DIAGNOSIS — M791 Myalgia: Secondary | ICD-10-CM | POA: Insufficient documentation

## 2015-10-08 DIAGNOSIS — R197 Diarrhea, unspecified: Secondary | ICD-10-CM | POA: Insufficient documentation

## 2015-10-08 DIAGNOSIS — F17210 Nicotine dependence, cigarettes, uncomplicated: Secondary | ICD-10-CM | POA: Insufficient documentation

## 2015-10-08 DIAGNOSIS — R05 Cough: Secondary | ICD-10-CM | POA: Insufficient documentation

## 2015-10-08 DIAGNOSIS — Z79899 Other long term (current) drug therapy: Secondary | ICD-10-CM | POA: Insufficient documentation

## 2015-10-08 MED ORDER — METHOCARBAMOL 500 MG PO TABS: 500.0000 mg | ORAL_TABLET | Freq: Two times a day (BID) | ORAL | Status: DC

## 2015-10-08 NOTE — Discharge Instructions (Signed)

## 2015-10-08 NOTE — ED Notes (Signed)
Pt given rolled up towel and taught how to splint area on ribs that is painful. Pt stated that he felt that this helped to decrease pain when coughing.

## 2015-10-08 NOTE — ED Notes (Signed)
Bowie PA at bedside 

## 2015-10-08 NOTE — ED Provider Notes (Signed)
CSN: [id number]     Arrival date & time 10/08/15  1950 History  By signing my name below, I, Murriel Hopper, attest that this documentation has been prepared under the direction and in the presence of Fayrene Helper, PA-C.  Electronically Signed: Murriel Hopper, ED Scribe. 10/08/2015. 10:42 PM.   Chief Complaint  Patient presents with  . Muscle Pain    The patient has been diagnosed with Costocondritis on Monday.  He went to [facility] and they diagnosed him.  He says he is not getting better and was told to follow up with [place].       Patient is a 35 y.o. male presenting with musculoskeletal pain. The history is provided by the patient. No language interpreter was used.  Muscle Pain   HPI Comments: Damon Mckee is a 35 y.o. male who presents to the Emergency Department complaining of constant, worsening 10/10 muscle pain to the right side of his ribcage. Pt states he was seen at urgent care three days ago and diagnosed with Costochondritis. Pt received Naproxen but states he has not been taking it consistently, and reports he has had no relief with it. Pt has also had a productive cough with yellow sputum in addition to body aches that has been present for about two weeks. Pt states he has been using an inhaler and taken a mucous relief medication with mild relief. Pt states he had diarrhea two times this morning. Pt denies a hx of asthma, denies  Injuring himself in any way, denies recent travel or any recent surgeries. Pt denies fever or chills.    Past Medical History  Diagnosis Date  . Allergy   . GERD (gastroesophageal reflux disease)    History reviewed. No pertinent past surgical history. Family History  Problem Relation Age of Onset  . Cancer Mother 59    Colon Cancer (Stage IV)  . Diabetes Mother   . Hypertension Mother   . Cancer Father 51    Prostate Cancer  . Diabetes Father   . Hypertension Father   . Alcohol abuse Father    Social History  Substance Use Topics  . Smoking  status: Current Every Day Smoker -- 0.50 packs/day    Types: Cigarettes  . Smokeless tobacco: None  . Alcohol Use: Yes     Comment: every other week    Review of Systems  Constitutional: Negative for fever and chills.  Respiratory: Positive for cough.   Gastrointestinal: Positive for diarrhea.  Musculoskeletal: Positive for myalgias.      Allergies  Review of patient's allergies indicates no known allergies.  Home Medications   Prior to Admission medications   Medication Sig Start Date End Date Taking? Authorizing Provider  naproxen (NAPROSYN) 500 MG tablet Take 1 tablet (500 mg total) by mouth 2 (two) times daily. 10/06/15   Tharon Aquas, PA  ranitidine (ZANTAC) 150 MG tablet Take 1 tablet (150 mg total) by mouth 2 (two) times daily. 09/26/15   Ardith Dark, MD   BP 140/104 mmHg  Pulse 62  Temp(Src) 98.6 F (37 C) (Oral)  Resp 20  Ht  (1.651 m)  Wt 159 lb 1 oz (72.15 kg)  BMI 26.47 kg/m2  SpO2 100% Physical Exam  Constitutional: He is oriented to person, place, and time. He appears well-developed and well-nourished.  HENT:  Head: Normocephalic and atraumatic.  Right Ear: External ear normal.  Left Ear: External ear normal.  Mouth/Throat: Oropharynx is clear and moist.  Neck: Normal  range of motion. Neck supple.  Cardiovascular: Normal rate.  Exam reveals no gallop and no friction rub.   No murmur heard. Pulmonary/Chest: Effort normal. He exhibits tenderness.  Tenderness to right anterior chest wall without crepitus or emphysema  Abdominal: Soft. He exhibits no distension. There is no tenderness.  Lymphadenopathy:    He has no cervical adenopathy.  Neurological: He is alert and oriented to person, place, and time.  Skin: Skin is warm and dry.  Psychiatric: He has a normal mood and affect.  Nursing note and vitals reviewed.   ED Course  Procedures (including critical care time)  DIAGNOSTIC STUDIES: Oxygen Saturation is 100% on room air, normal by my  interpretation.    COORDINATION OF CARE: 10:25 PM Discussed treatment plan with pt at bedside and pt agreed to plan.   Labs Review Labs Reviewed - No data to display  Imaging Review Dg Chest 2 View  10/08/2015  CLINICAL DATA:  Acute onset of right-sided rib pain. Recent cough. Initial encounter. EXAM: CHEST  2 VIEW COMPARISON:  Chest radiograph performed 10/06/2015 FINDINGS: The lungs are well-aerated and clear. There is no evidence of focal opacification, pleural effusion or pneumothorax. The heart is normal in size; the mediastinal contour is within normal limits. No acute osseous abnormalities are seen. IMPRESSION: No acute cardiopulmonary process seen. No displaced rib fractures identified. Electronically Signed   By: Roanna Raider M.D.   On: 10/08/2015 22:13   I have personally reviewed and evaluated these images and lab results as part of my medical decision-making.   EKG Interpretation None      MDM   Final diagnoses:  Chest pain  Costochondritis    BP 140/104 mmHg  Pulse 62  Temp(Src) 98.6 F (37 C) (Oral)  Resp 20  Ht  (1.651 m)  Wt 72.15 kg  BMI 26.47 kg/m2  SpO2 100%  I personally performed the services described in this documentation, which was scribed in my presence. The recorded information has been reviewed and is accurate.     10:44 PM Patient report having persistent cough for the past 2 weeks and now having chest wall pain consistence with costochondritis. Chest x-ray today shows no signs of pneumonia. No abdominal pain on exam. Patient is well-appearing. I encourage rice therapy, using a pillow against chest when cough or laugh. I will also provide muscle relaxant.   Fayrene Helper, PA-C 10/08/15 2247  Glynn Octave, MD 10/09/15 [phone]

## 2015-10-08 NOTE — ED Notes (Signed)
The patient has been diagnosed with Costocondritis on Monday.  He went to [facility] and they diagnosed him.  He says he is not getting better and was told to follow up with [place].  He rates his pain 10/10.  He denies any other symptoms.  He says it is under his right rib.

## 2015-10-17 ENCOUNTER — Telehealth: Payer: Self-pay | Admitting: Family Medicine

## 2015-10-17 NOTE — Telephone Encounter (Signed)
Pt called and would like to speak to Tamika about a issue. He doesn't feel comfortable talking to a scheduler about his issue. Please call back. jw

## 2015-10-17 NOTE — Telephone Encounter (Signed)
Left voice message for to call for an appointment if needed be.  Patient will not be able to be seen in clinic today.  Patient may go to urgent if needed to be seen today.  Clovis Pu, RN

## 2016-09-05 IMAGING — DX DG CHEST 2V
2 series · 2 of 2 positions shown · non-contrast
Comparison: Radiographs 05/01/2014.

CLINICAL DATA: Right-sided chest pain with cough for 1 week.
Smoker.

EXAM:
CHEST  2 VIEW

[chest pa]
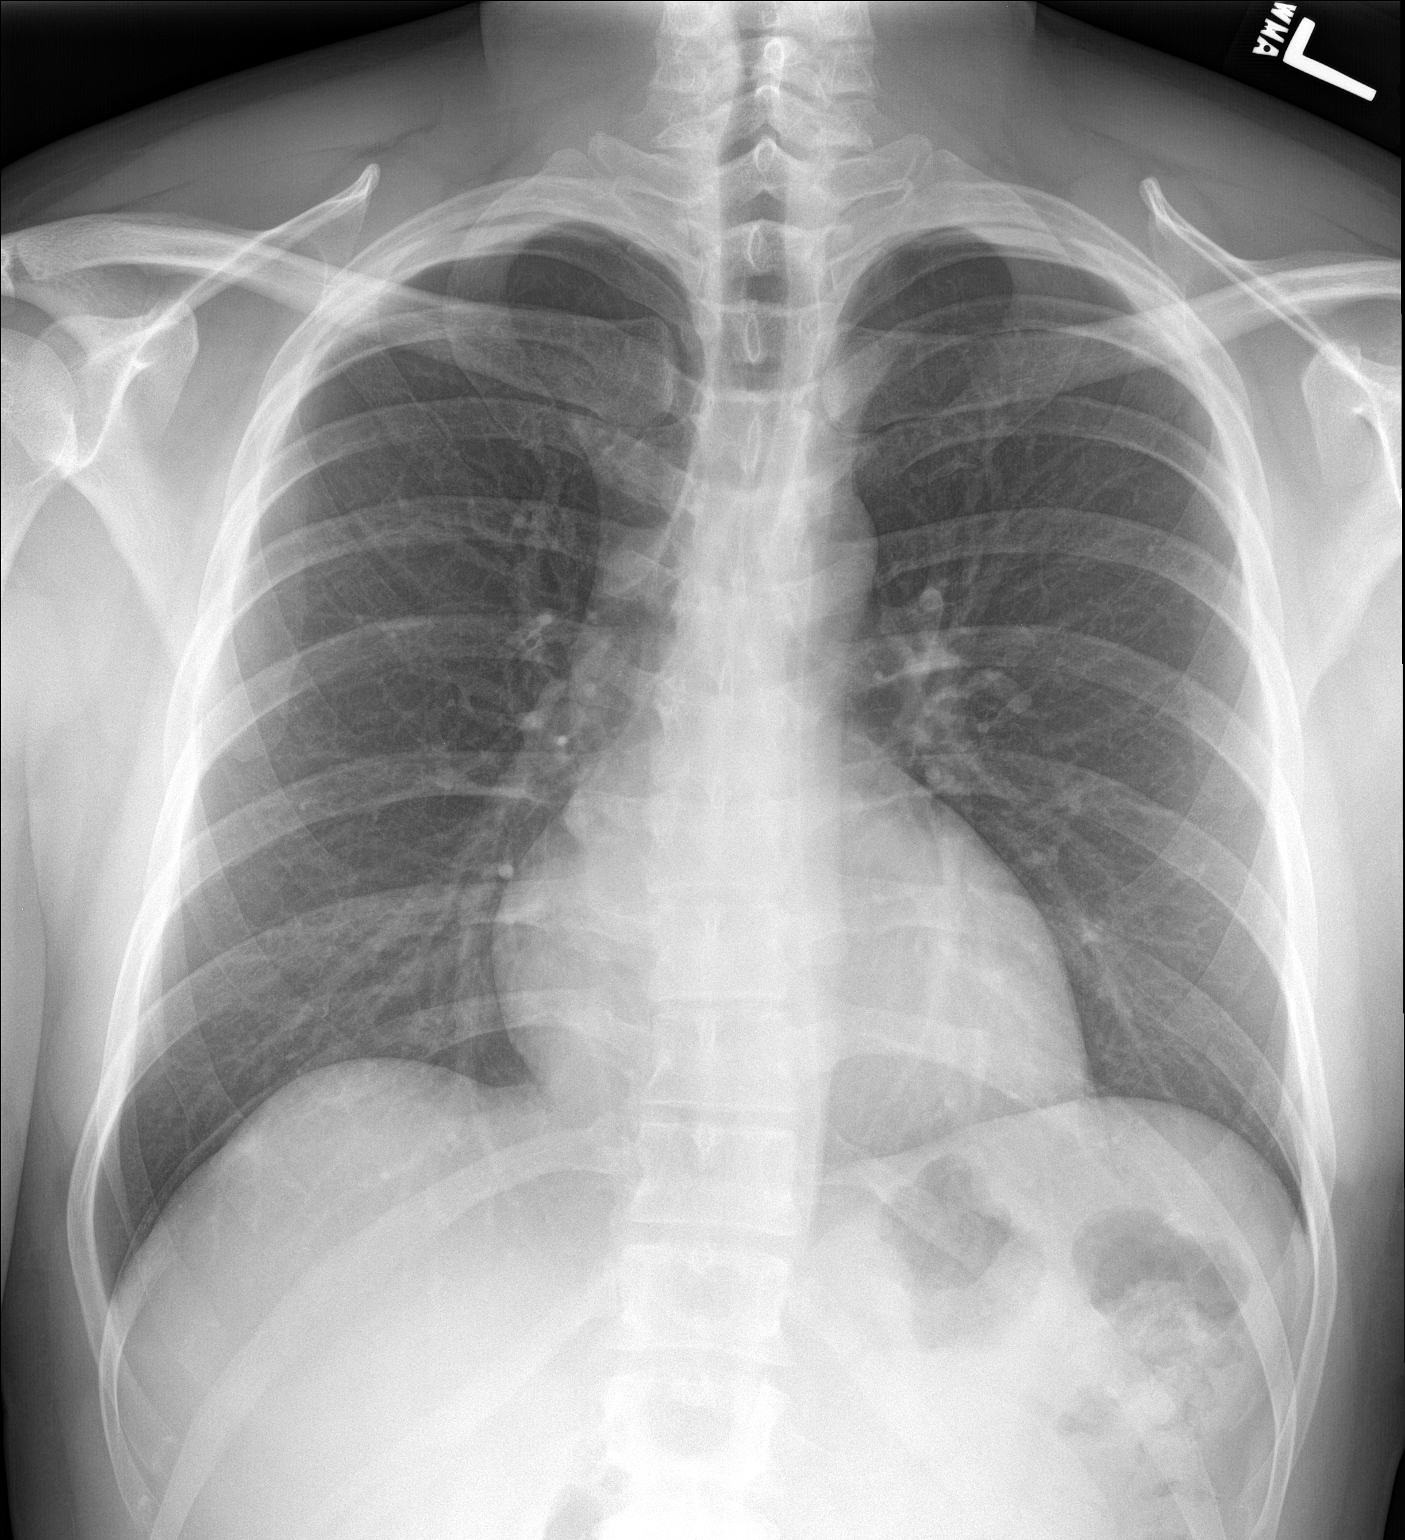

[chest lat]
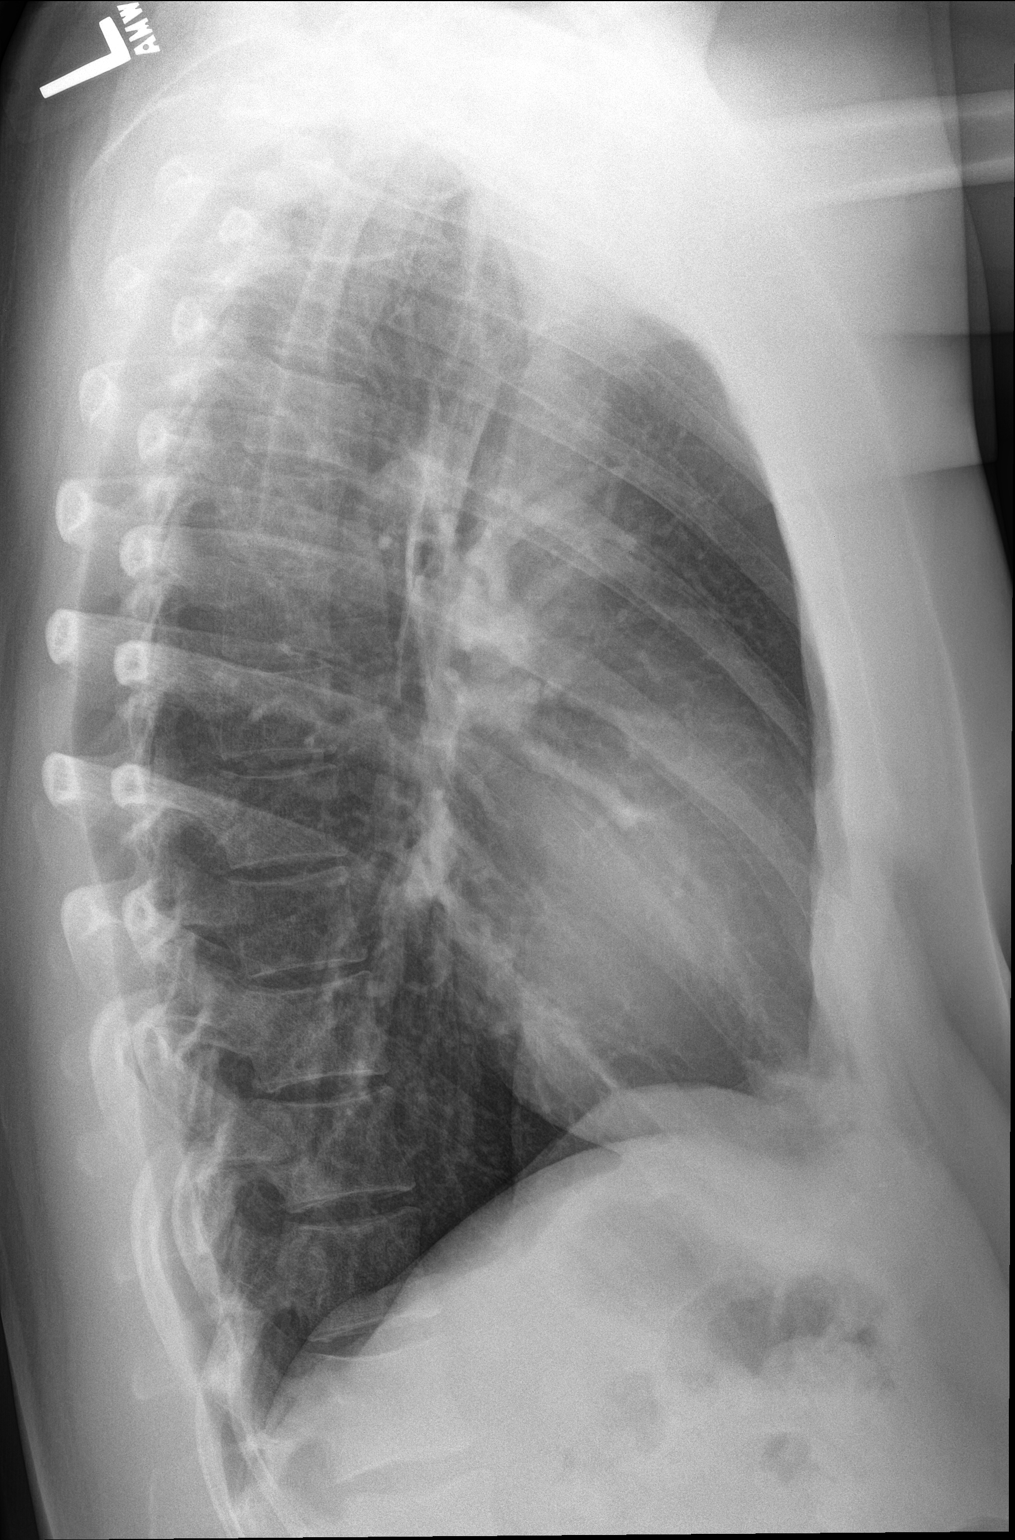

[2 of 2 positions shown; findings below may reference images not displayed]

FINDINGS: The heart size and mediastinal contours are normal. The lungs are
clear. There is no pleural effusion or pneumothorax. No acute
osseous findings are identified.
IMPRESSION: Stable chest.  No active cardiopulmonary process.

## 2016-12-03 ENCOUNTER — Ambulatory Visit (INDEPENDENT_AMBULATORY_CARE_PROVIDER_SITE_OTHER): Payer: Medicaid Other | Admitting: Family Medicine

## 2016-12-03 ENCOUNTER — Encounter: Payer: Self-pay | Admitting: Family Medicine

## 2016-12-03 VITALS — BP 120/78 | HR 74 | Temp 98.7°F | Ht 65.0 in | Wt 156.0 lb

## 2016-12-03 DIAGNOSIS — L03116 Cellulitis of left lower limb: Secondary | ICD-10-CM

## 2016-12-03 DIAGNOSIS — L0390 Cellulitis, unspecified: Secondary | ICD-10-CM | POA: Insufficient documentation

## 2016-12-03 MED ORDER — DOXYCYCLINE HYCLATE 100 MG PO TABS
100.0000 mg | ORAL_TABLET | Freq: Two times a day (BID) | ORAL | 0 refills | Status: DC
Start: 2016-12-03 — End: 2017-12-01

## 2016-12-03 NOTE — Assessment & Plan Note (Addendum)
Exam and history consistent with cellulitis. No evidence of fournier gangrene/ necrotizing cellulitis on exam or history. No systemic symptoms. Given patient's report of drainage previously, will treat as purulent cellulitis. No fluctuance noted on my exam to day. - Rx for doxycycline  BID x 7 days.  - pt to f/u in clinic on Monday. - advised to use warm compresses at least twice daily - discussed strict return precautions such as worsening erythema, spreading erythema, worsening pain, fevers, chills, nausea, vomiting, or drainage

## 2016-12-03 NOTE — Patient Instructions (Signed)
I have prescribed you doxycycline, an antibiotic. Take 1 capsule twice daily.  Use warm compresses on the area at least twice daily.  If you note worsening pain, fevers, or chills, seek care immediately.  Follow up in our clinic on Monday so we can ensure the area is healing well.   Cellulitis, Adult Cellulitis is a skin infection. The infected area is usually red and sore. This condition occurs most often in the arms and lower legs. It is very important to get treated for this condition. Follow these instructions at home:  Take over-the-counter and prescription medicines only as told by your doctor.  If you were prescribed an antibiotic medicine, take it as told by your doctor. Do not stop taking the antibiotic even if you start to feel better.  Drink enough fluid to keep your pee (urine) clear or pale yellow.  Do not touch or rub the infected area.  Raise (elevate) the infected area above the level of your heart while you are sitting or lying down.  Place warm or cold wet cloths (warm or cold compresses) on the infected area. Do this as told by your doctor.  Keep all follow-up visits as told by your doctor. This is important. These visits let your doctor make sure your infection is not getting worse. Contact a doctor if:  You have a fever.  Your symptoms do not get better after 1-2 days of treatment.  Your bone or joint under the infected area starts to hurt after the skin has healed.  Your infection comes back. This can happen in the same area or another area.  You have a swollen bump in the infected area.  You have new symptoms.  You feel ill and also have muscle aches and pains. Get help right away if:  Your symptoms get worse.  You feel very sleepy.  You throw up (vomit) or have watery poop (diarrhea) for a long time.  There are red streaks coming from the infected area.  Your red area gets larger.  Your red area turns darker. This information is not  intended to replace advice given to you by your health care provider. Make sure you discuss any questions you have with your health care provider. Document Released: 02/02/2008 Document Revised: 01/22/2016 Document Reviewed: 06/25/2015 Elsevier Interactive Patient Education  2017 [employer].

## 2016-12-03 NOTE — Progress Notes (Signed)
    Subjective: [id number] HPI: Patient is a 36 y.o. male presenting to clinic today for a SDA for boils in the groin region.  Patient noted boils in his groin over the last month. Boils are painful and sometimes pruritic. Painful to sit. One area drained some green material after pulling a scab off per patient report. No drainage since then. It does not involve his scrotum or perineum.  Noted one area under his arm at one point but it went away. No fevers, chills, nausea, vomiting.   He's been putting alcohol on it, it seems to help.   He's never had this before. No one else has this rash.   Social History: current smoker   ROS: All other systems reviewed and are negative.  Past Medical History Patient Active Problem List   Diagnosis Date Noted  . Cellulitis 12/03/2016  . STD exposure 09/26/2015  . Tobacco abuse 12/03/2014  . Screening for cholesterol level 12/03/2014  . Screening for HIV without presence of risk factors 12/03/2014  . Well adult exam 12/03/2014  . Anxiety state 12/07/2013  . Allergic rhinitis 04/27/2007  . GERD 04/27/2007  . CONSTIPATION 04/27/2007    Medications- reviewed and updated Current Outpatient Prescriptions  Medication Sig Dispense Refill  . doxycycline (VIBRA-TABS) 100 MG tablet Take 1 tablet (100 mg total) by mouth 2 (two) times daily. 14 tablet 0  . methocarbamol (ROBAXIN) 500 MG tablet Take 1 tablet (500 mg total) by mouth 2 (two) times daily. 20 tablet 0  . naproxen (NAPROSYN) 500 MG tablet Take 1 tablet (500 mg total) by mouth 2 (two) times daily. 30 tablet 0  . ranitidine (ZANTAC) 150 MG tablet Take 1 tablet (150 mg total) by mouth 2 (two) times daily. 60 tablet 1   No current facility-administered medications for this visit.     Objective: Office vital signs reviewed. BP 120/78   Pulse 74   Temp 98.7 F (37.1 C) (Oral)   Ht  (1.651 m)   Wt 156 lb (70.8 kg)   SpO2 98%   BMI 25.96 kg/m    Physical Examination:  General:  Awake, alert, well- nourished, NAD. Seems tired, intermittently laughing/giggling inappropriately. Cardio: RRR, no m/r/g noted.  Pulm: No increased WOB.  CTAB, without wheezes, rhonchi or crackles noted.  Skin: 10cm x 7cm circular area of erythema and warmth on the left upper, inner thigh. Induration measuring 3cm without fluctuance. Another area that is hyperpigmented just superior to this with mild induration. No crepitus.  No scrotal or perineal involvement.   Assessment/Plan: Cellulitis Exam and history consistent with cellulitis. No evidence of fournier gangrene/ necrotizing cellulitis on exam or history. No systemic symptoms. Given patient's report of drainage previously, will treat as purulent cellulitis. No fluctuance noted on my exam to day. - Rx for doxycycline  BID x 10 days. Dis   No orders of the defined types were placed in this encounter.   Meds ordered this encounter  Medications  . doxycycline (VIBRA-TABS) 100 MG tablet    Sig: Take 1 tablet (100 mg total) by mouth 2 (two) times daily.    Dispense:  14 tablet    Refill:  0    Joanna Puff PGY-3, [facility]

## 2016-12-06 ENCOUNTER — Ambulatory Visit (INDEPENDENT_AMBULATORY_CARE_PROVIDER_SITE_OTHER): Payer: Medicaid Other | Admitting: Family Medicine

## 2016-12-06 DIAGNOSIS — L03116 Cellulitis of left lower limb: Secondary | ICD-10-CM | POA: Diagnosis not present

## 2016-12-06 NOTE — Progress Notes (Signed)
    Subjective: CC: f/u boils  HPI: Patient is a 36 y.o. male presenting to clinic today for a f/u on cellulitis.  The patient has been taking doxycycline as prescribed. No side effects. He feels the area is improved from last week, certainly not as painful. He has been putting the shower head down around the area and putting hot water on it for approximately 5 minutes per day. He denies any drainage. No fevers, chills, nausea, or vomiting.   Social History: current smoker   ROS: All other systems reviewed and are negative.  Past Medical History Patient Active Problem List   Diagnosis Date Noted  . Cellulitis 12/03/2016  . STD exposure 09/26/2015  . Tobacco abuse 12/03/2014  . Screening for cholesterol level 12/03/2014  . Screening for HIV without presence of risk factors 12/03/2014  . Well adult exam 12/03/2014  . Anxiety state 12/07/2013  . Allergic rhinitis 04/27/2007  . GERD 04/27/2007  . CONSTIPATION 04/27/2007    Medications- reviewed and updated Current Outpatient Prescriptions  Medication Sig Dispense Refill  . doxycycline (VIBRA-TABS) 100 MG tablet Take 1 tablet (100 mg total) by mouth 2 (two) times daily. 14 tablet 0  . methocarbamol (ROBAXIN) 500 MG tablet Take 1 tablet (500 mg total) by mouth 2 (two) times daily. 20 tablet 0  . naproxen (NAPROSYN) 500 MG tablet Take 1 tablet (500 mg total) by mouth 2 (two) times daily. 30 tablet 0  . ranitidine (ZANTAC) 150 MG tablet Take 1 tablet (150 mg total) by mouth 2 (two) times daily. 60 tablet 1   No current facility-administered medications for this visit.     Objective: Office vital signs reviewed. BP (!) 160/98   Pulse 90   Temp 98.5 F (36.9 C) (Oral)   Wt 156 lb (70.8 kg)   BMI 25.96 kg/m    Physical Examination:  General: Awake, alert, well- nourished, NAD Cardio: RRR, no m/r/g noted.  Pulm: No increased WOB.  CTAB, without wheezes, rhonchi or crackles noted.  Skin: very faint erythema in the left inner,  upper thigh measuring 6x6cm in diameter with 1x1cm area of induration. No warmth. No fluctuance. No involvement of the scrotum or perineum. Examined with a chaperone.  Assessment/Plan: Cellulitis Significantly improved over the weekend. No systemic symptoms. - complete antibiotic course  - continue warm compresses - discussed return precautions.    No orders of the defined types were placed in this encounter.   No orders of the defined types were placed in this encounter.   Joanna Puff PGY-3, [facility]

## 2016-12-06 NOTE — Assessment & Plan Note (Signed)
Significantly improved over the weekend. No systemic symptoms. - complete antibiotic course  - continue warm compresses - discussed return precautions.

## 2016-12-06 NOTE — Patient Instructions (Signed)
Continue the antibiotics until you complete the course.   Cellulitis, Adult Cellulitis is a skin infection. The infected area is usually red and sore. This condition occurs most often in the arms and lower legs. It is very important to get treated for this condition. Follow these instructions at home:  Take over-the-counter and prescription medicines only as told by your doctor.  If you were prescribed an antibiotic medicine, take it as told by your doctor. Do not stop taking the antibiotic even if you start to feel better.  Drink enough fluid to keep your pee (urine) clear or pale yellow.  Do not touch or rub the infected area.  Raise (elevate) the infected area above the level of your heart while you are sitting or lying down.  Place warm or cold wet cloths (warm or cold compresses) on the infected area. Do this as told by your doctor.  Keep all follow-up visits as told by your doctor. This is important. These visits let your doctor make sure your infection is not getting worse. Contact a doctor if:  You have a fever.  Your symptoms do not get better after 1-2 days of treatment.  Your bone or joint under the infected area starts to hurt after the skin has healed.  Your infection comes back. This can happen in the same area or another area.  You have a swollen bump in the infected area.  You have new symptoms.  You feel ill and also have muscle aches and pains. Get help right away if:  Your symptoms get worse.  You feel very sleepy.  You throw up (vomit) or have watery poop (diarrhea) for a long time.  There are red streaks coming from the infected area.  Your red area gets larger.  Your red area turns darker. This information is not intended to replace advice given to you by your health care provider. Make sure you discuss any questions you have with your health care provider. Document Released: 02/02/2008 Document Revised: 01/22/2016 Document Reviewed:  06/25/2015 Elsevier Interactive Patient Education  2017 [employer].

## 2016-12-31 ENCOUNTER — Other Ambulatory Visit: Payer: Self-pay | Admitting: Family Medicine

## 2016-12-31 NOTE — Telephone Encounter (Signed)
Pt informed and scheduled for an appt. Deseree Blount, CMA  

## 2016-12-31 NOTE — Telephone Encounter (Signed)
We do not give refills on antibiotics.  He will need to be re-evaluated to determine if he needs abx.  Erasmo DownerAngela M Bacigalupo, MD, MPH PGY-3,  [facility] 12/31/2016 2:16 PM

## 2016-12-31 NOTE — Telephone Encounter (Signed)
Would like a refill on the antibotics given on April 9 for his  Boils.  One has cleared up and and another is starting.  [facility] at [place] and [place]

## 2017-01-03 ENCOUNTER — Ambulatory Visit (INDEPENDENT_AMBULATORY_CARE_PROVIDER_SITE_OTHER): Payer: Medicaid Other | Admitting: Student

## 2017-01-03 ENCOUNTER — Encounter: Payer: Self-pay | Admitting: Student

## 2017-01-03 VITALS — BP 110/64 | HR 67 | Temp 98.0°F | Ht 65.0 in | Wt 156.0 lb

## 2017-01-03 DIAGNOSIS — L0292 Furuncle, unspecified: Secondary | ICD-10-CM | POA: Insufficient documentation

## 2017-01-03 MED ORDER — SULFAMETHOXAZOLE-TRIMETHOPRIM 800-160 MG PO TABS: 1.0000 | ORAL_TABLET | Freq: Two times a day (BID) | ORAL | 0 refills | Status: DC

## 2017-01-03 NOTE — Progress Notes (Signed)
   Subjective:    Patient ID: Damon Mckee, male    DOB: 05/16/1981, 36 y.o.   MRN: [id number]   CC: boil  HPI: 36 y/o M presents for concern for boil  Boil - noted it 4-5 days ago on his right butt cheek - it stared as a small pimple that he picked then grew in size and became more painful - no fevers - he significantly was recently treated for cellulitis of the left thigh with doxycycline  Smoking status reviewed  Review of Systems  Per HPI, else denies, chest pain, shortness of breath,     Objective:  BP 110/64   Pulse 67   Temp 98 F (36.7 C)   Ht 5\' 5"  (1.651 m)   Wt 156 lb (70.8 kg)   SpO2 97%   BMI 25.96 kg/m  Vitals and nursing note reviewed  General: NAD Cardiac: RRR,  Respiratory: CTAB, normal effort Abdomen: soft, nontender, nondistended, no hepatic or splenomegaly. Skin: approximately 1 cm open and draining of purulent fluid boil on the right buttock with surround erythema and induration approximately 4 cm in diameter, not adjacent to gluteal cleft Neuro: alert and oriented, no focal deficits   Assessment & Plan:    Boil Boil of right buttock with surrounding cellulitis, actively draining.  - abscess culture obtained - will treat with bactrim x10 days - follow up in 1 week or earlier as needed - return precautions discussed    Tiegan Jambor A. Kennon RoundsHaney MD, MS Family Medicine Resident PGY-3 Pager [phone]

## 2017-01-03 NOTE — Patient Instructions (Signed)
Follow up in one week for boil You were found to have an abscess Take bactrim as prescribed If you have fevers or you feel your abscess is worse, return to the office to be seen

## 2017-01-03 NOTE — Assessment & Plan Note (Signed)
Boil of right buttock with surrounding cellulitis, actively draining.  - abscess culture obtained - will treat with bactrim x10 days - follow up in 1 week or earlier as needed - return precautions discussed

## 2017-01-03 NOTE — Addendum Note (Signed)
Addended by: Velora HecklerHANEY, Masashi Snowdon A on: 01/03/2017 04:43 PM   Modules accepted: Orders

## 2017-01-07 LAB — ANAEROBIC AND AEROBIC CULTURE

## 2017-01-11 ENCOUNTER — Ambulatory Visit (INDEPENDENT_AMBULATORY_CARE_PROVIDER_SITE_OTHER): Payer: Medicaid Other | Admitting: Family Medicine

## 2017-01-11 VITALS — BP 136/86 | HR 71 | Temp 97.6°F | Ht 65.0 in | Wt 157.0 lb

## 2017-01-11 DIAGNOSIS — B356 Tinea cruris: Secondary | ICD-10-CM | POA: Insufficient documentation

## 2017-01-11 DIAGNOSIS — L0292 Furuncle, unspecified: Secondary | ICD-10-CM | POA: Diagnosis not present

## 2017-01-11 MED ORDER — CLOTRIMAZOLE 1 % EX CREA: 1.0000 "application " | TOPICAL_CREAM | Freq: Two times a day (BID) | CUTANEOUS | 0 refills | Status: DC

## 2017-01-11 NOTE — Patient Instructions (Signed)
  Jock Itch Jock itch (tinea cruris) is a fungal infection of the skin in the groin area. It is sometimes called ringworm, even though it is not caused by worms. It is caused by a fungus, which is a type of germ that thrives in dark, damp places. Jock itch causes a rash and itching in the groin and upper thigh area. It usually goes away in 2-3 weeks with treatment. What are the causes? The fungus that causes jock itch may be spread by:  Touching a fungus infection elsewhere on your body-such as athlete's foot-and then touching your groin area.  Sharing towels or clothing with an infected person. What increases the risk? Jock itch is most common in men and adolescent boys. This condition is more likely to develop from:  Being in hot, humid climates.  Wearing tight-fitting clothing or wet bathing suits for long periods of time.  Participating in sports.  Being overweight.  Having diabetes. What are the signs or symptoms? Symptoms of jock itch may include:  A red, pink, or brown rash in the groin area. The rash may spread to the thighs, anus, and buttocks.  Dry and scaly skin on or around the rash.  Itchiness. How is this diagnosed? Most often, a health care provider can make the diagnosis by looking at your rash. Sometimes, a scraping of the infected skin will be taken. This sample may be tested by looking at it under a microscope or by trying to grow the fungus from the sample (culture). How is this treated? Treatment for this condition may include:  Antifungal medicine to kill the fungus. This may be in various forms:  Skin cream or ointment.  Medicine taken by mouth.  Skin cream or ointment to reduce the itching.  Compresses or medicated powders to dry the infected skin. Follow these instructions at home:  Take medicines only as directed by your health care provider. Apply skin creams or ointments exactly as directed.  Wear loose-fitting clothing.  Men should wear  cotton boxer shorts.  Women should wear cotton underwear.  Change your underwear every day to keep your groin dry.  Avoid hot baths.  Dry your groin area well after bathing.  Use a separate towel to dry your groin area. This will help to prevent a spreading of the infection to other areas of your body.  Do not scratch the affected area.  Do not share towels with other people. Contact a health care provider if:  Your rash does not improve or it gets worse after 2 weeks of treatment.  Your rash is spreading.  Your rash returns after treatment is finished.  You have a fever.  You have redness, swelling, or pain in the area around your rash.  You have fluid, blood, or pus coming from your rash.  Your have your rash for more than 4 weeks. This information is not intended to replace advice given to you by your health care provider. Make sure you discuss any questions you have with your health care provider. Document Released: 08/06/2002 Document Revised: 01/22/2016 Document Reviewed: 05/28/2014 Elsevier Interactive Patient Education  2017 [employer]  

## 2017-01-11 NOTE — Assessment & Plan Note (Signed)
Exam c/w tinea cruris Treat with clotrimazole BID

## 2017-01-11 NOTE — Assessment & Plan Note (Signed)
Healing well  Cellulitis resolved Finish bactrim course Warm compresses Keep area clean and dry Return precautions discussed

## 2017-01-11 NOTE — Progress Notes (Signed)
   Subjective:   Damon Mckee is a 36 y.o. male with a history of GERD, constipation, recurrent abscesses here for abscess and cellulitis follow-up  Patient was seen 01/03/17 for boil of the right buttock with surrounding cellulitis that was already actively draining. The abscess was cultured and grew MRSA. This was treated with a 10 day course of Bactrim. Patient reports that since that time, the drainage has subsided, the redness is gone, the pain is gone, and it seems to be healing.  He is taking the Bactrim as prescribed and has a few more days left.  He also complains of a rash in his groin that has darkened the skin and is itching. He thinks is related to sweating a lot. He tried an over-the-counter anti-itch cream that did not seem to help.  Review of Systems:  Per HPI.   Social History: Current smoker  Objective:  BP 136/86   Pulse 71   Temp 97.6 F (36.4 C) (Oral)   Ht 5\' 5"  (1.651 m)   Wt 157 lb (71.2 kg)   BMI 26.13 kg/m   Gen:  36 y.o. male in NAD  HEENT: NCAT, MMM, anicteric sclerae CV: RRR, no MRG Resp: Non-labored, CTAB, no wheezes noted Abd: Soft, NTND, BS present, no guarding or organomegaly Ext: WWP, no edema Skin: 0.5cm opening without drainage and small underlying indurated area of R buttock, no surrounding erythema, not adjacent to gluteal cleft, darkened skin of groin b/l Neuro: Alert and oriented, speech normal      Assessment & Plan:     Damon Mckee is a 36 y.o. male here for   Boil Healing well  Cellulitis resolved Finish bactrim course Warm compresses Keep area clean and dry Return precautions discussed  Tinea cruris Exam c/w tinea cruris Treat with clotrimazole BID   Erasmo DownerBacigalupo, Hayley Horn M, MD MPH PGY-3,  [facility] 01/11/2017  4:09 PM

## 2017-09-12 ENCOUNTER — Ambulatory Visit: Payer: Self-pay | Admitting: Internal Medicine

## 2017-09-16 ENCOUNTER — Encounter: Payer: Self-pay | Admitting: Internal Medicine

## 2017-09-16 ENCOUNTER — Other Ambulatory Visit: Payer: Self-pay

## 2017-09-16 ENCOUNTER — Other Ambulatory Visit (HOSPITAL_COMMUNITY)
Admission: RE | Admit: 2017-09-16 | Discharge: 2017-09-16 | Disposition: A | Discharge status: Home / Self Care | Payer: Medicaid Other | Source: Ambulatory Visit | Attending: Family Medicine | Admitting: Family Medicine

## 2017-09-16 ENCOUNTER — Ambulatory Visit: Payer: Medicaid Other | Admitting: Internal Medicine

## 2017-09-16 VITALS — BP 122/68 | HR 60 | Temp 98.5°F | Ht 65.0 in | Wt 150.0 lb

## 2017-09-16 DIAGNOSIS — R822 Biliuria: Secondary | ICD-10-CM

## 2017-09-16 DIAGNOSIS — R300 Dysuria: Secondary | ICD-10-CM | POA: Insufficient documentation

## 2017-09-16 DIAGNOSIS — R1084 Generalized abdominal pain: Secondary | ICD-10-CM | POA: Insufficient documentation

## 2017-09-16 DIAGNOSIS — Z720 Tobacco use: Secondary | ICD-10-CM | POA: Diagnosis not present

## 2017-09-16 DIAGNOSIS — F17210 Nicotine dependence, cigarettes, uncomplicated: Secondary | ICD-10-CM | POA: Insufficient documentation

## 2017-09-16 LAB — POCT UA - MICROSCOPIC ONLY

## 2017-09-16 LAB — POCT URINALYSIS DIP (MANUAL ENTRY)
Bilirubin, UA: NEGATIVE
Glucose, UA: NEGATIVE mg/dL
NITRITE UA: NEGATIVE
PH UA: 7.5 (ref 5.0–8.0)
RBC UA: NEGATIVE
SPEC GRAV UA: 1.02 (ref 1.010–1.025)
Urobilinogen, UA: 2 E.U./dL — AB

## 2017-09-16 MED ORDER — POLYETHYLENE GLYCOL 3350 17 GM/SCOOP PO POWD: 17.0000 g | Freq: Every day | ORAL | 0 refills | Status: DC

## 2017-09-16 NOTE — Assessment & Plan Note (Addendum)
-   Acute on chronic. No red flags of weight loss or hematochezia. Given report of infrequent BMs, bloating, and pain that changes location, suspect constipation contributing; this would also fit with report of low water intake and very concentrated urine. Could have also had recent urinary tract infection with dysuria a few days ago and leukocytes on UA; will also screen for gc/chlamydia. Urobilinogen elevated at 2.0, so will obtain CMP to check liver function and CBC for anemia that could indicate hemolysis. Could have diverticulitis but no current fever and no blood in stools and tolerating PO fine.  - Recommended patient increase fiber and water intake. Prescribed miralax to take daily to see if this improves frequency of bowel movements and sensation of bloating. Given chronicity of pain and concern due to mother's cancer history, would consider referral to GI if pain worsens or no improvement.  - Asked to track where his pain is in interim - Ordered urine culture; precepted with Dr. Lum BabeEniola to await cultures before deciding to treat with antibiotics since no longer having dysuria rather than empirically with levaquin or cipro

## 2017-09-16 NOTE — Progress Notes (Signed)
[facility] Progress Note  Subjective:  Damon Mckee is a 37 y.o. male with history of GERD who presents with concern for abdominal pain. He reports this is a chronic problem that has occurred "on and off" for many years but that he has grown more concerned since his mom died from colon cancer in her mid-50s. He says his brother had benign polyps on colonoscopy. He says pain has been more bothersome over the last week, which is why he made the appointment. He denies change in appetite or weight loss. He sometimes sweats at night but does not drench sheets. No dark stools. No vomiting or nausea. No fever. He did have some dysuria, but this resolved a few days ago. Is currently sexually active. Has BMs about 2-3 times a week; they are loose and "kind of mucousy" (showed [place] Stool chart and he reported stools as a 5 or 6). Says he drinks very little water but a lot of [facility] [employer]. Has bloating sensation under ribs. Pain migrates but has felt it at his "sides" more lately. ROS: Reports some pain with inspiration; intermittent cough and sensation of phlegm being caught in his chest.  Social: Current, every day smoker of about 1/2 pack or 1 pack a day. Says he quit in the past because he "didn't have a choice." Would like to quit but envisions himself continuing "as long as [he] can afford them." Recently started drinking alcohol again but thinks it worsens his GERD, so plans to quit again -- usually 2 drinks on occasion.   No Known Allergies  Social History   Tobacco Use  . Smoking status: Current Every Day Smoker    Packs/day: 0.50    Types: Cigarettes  . Smokeless tobacco: Never Used  Substance Use Topics  . Alcohol use: Yes    Comment: every other week    Objective: Blood pressure 122/68, pulse 60, temperature 98.5 F (36.9 C), temperature source Oral, height 5\' 5"  (1.651 m), weight 150 lb (68 kg), SpO2 99 %. Body mass index is 24.96 kg/m. Constitutional:  Well-appearing male in NAD Cardiovascular: RRR, S1, S2, no m/r/g.  Pulmonary/Chest: Effort normal and breath sounds normal.  Abdominal: Soft. +BS, mildly TTP in RLQ and suprapubic region. No hepatomegaly appreciated.  MSK: No CVA tenderness.  Psychiatric: Normal mood and affect.  Vitals reviewed  Recent Results (from the past 2160 hour(s))  POCT urinalysis dipstick     Status: Abnormal   Collection Time: 09/16/17  3:44 PM  Result Value Ref Range   Color, UA yellow yellow   Clarity, UA clear clear   Glucose, UA negative negative mg/dL   Bilirubin, UA negative negative   Ketones, POC UA small (15) (A) negative mg/dL   Spec Grav, UA [id number] - 1.025   Blood, UA negative negative   pH, UA 7.5 5.0 - 8.0   Protein Ur, POC =30 (A) negative mg/dL   Urobilinogen, UA 2.0 (A) 0.2 or 1.0 E.U./dL   Nitrite, UA Negative Negative   Leukocytes, UA Moderate (2+) (A) Negative  POCT UA - Microscopic Only     Status: Abnormal   Collection Time: 09/16/17  3:44 PM  Result Value Ref Range   WBC, Ur, HPF, POC 1-5    RBC, urine, microscopic NONE    Bacteria, U Microscopic FEW    Epithelial cells, urine per micros 0-3    Assessment/Plan: Generalized abdominal pain - Acute on chronic. No red flags of weight loss or hematochezia. Given  report of infrequent BMs, bloating, and pain that changes location, suspect constipation contributing; this would also fit with report of low water intake and very concentrated urine. Could have also had recent urinary tract infection with dysuria a few days ago and leukocytes on UA; will also screen for gc/chlamydia. Urobilinogen elevated at 2.0, so will obtain CMP to check liver function and CBC for anemia that could indicate hemolysis. Could have diverticulitis but no current fever and no blood in stools and tolerating PO fine.  - Recommended patient increase fiber and water intake. Prescribed miralax to take daily to see if this improves frequency of bowel movements and  sensation of bloating. Given chronicity of pain and concern due to mother's cancer history, would consider referral to GI if pain worsens or no improvement.  - Asked to track where his pain is in interim - Ordered urine culture; precepted with Dr. Lum Babe to await cultures before deciding to treat with antibiotics since no longer having dysuria rather than empirically with levaquin or cipro  Tobacco abuse - Patient not yet ready to quit but would plan to do so [place]. - Counseled that multiple quit attempts are common and on availability of cessation aids - Counseled on increased cancer risk  Follow-up in about 1 month to reassess abdominal pain.  Dani Gobble, MD [facility], PGY-3

## 2017-09-16 NOTE — Patient Instructions (Addendum)
[name],  I suspect your abdominal pain is from constipation. I recommend taking 1 teaspoon miralax daily for the next few weeks to see if this helps you be more regular and improves pain.  For the possible urinary tract infection, we will call if the urine culture is concerning for infection. If so, we will call in an antibiotic.  See us back in about 1 month to see how you are doing and whether a specialist referral is warranted.   Keep track of where your pain is.   Increase water intake (at least 64 ounces a day).   Best, Dr. Sampson GoonFitzgerald

## 2017-09-16 NOTE — Assessment & Plan Note (Signed)
-   Patient not yet ready to quit but would plan to do so [place]. - Counseled that multiple quit attempts are common and on availability of cessation aids - Counseled on increased cancer risk

## 2017-09-17 ENCOUNTER — Encounter: Payer: Self-pay | Admitting: Internal Medicine

## 2017-09-17 LAB — COMPREHENSIVE METABOLIC PANEL
ALBUMIN: 5 g/dL (ref 3.5–5.5)
ALK PHOS: 84 IU/L (ref 39–117)
ALT: 17 IU/L (ref 0–44)
AST: 21 IU/L (ref 0–40)
Albumin/Globulin Ratio: 2.3 — ABNORMAL HIGH (ref 1.2–2.2)
BILIRUBIN TOTAL: 0.3 mg/dL (ref 0.0–1.2)
BUN / CREAT RATIO: 9 (ref 9–20)
BUN: 7 mg/dL (ref 6–20)
CO2: 27 mmol/L (ref 20–29)
CREATININE: 0.74 mg/dL — AB (ref 0.76–1.27)
Calcium: 9.6 mg/dL (ref 8.7–10.2)
Chloride: 102 mmol/L (ref 96–106)
GFR calc non Af Amer: 120 mL/min/{1.73_m2} (ref >59)
GFR, EST AFRICAN AMERICAN: 138 mL/min/{1.73_m2} (ref >59)
GLOBULIN, TOTAL: 2.2 g/dL (ref 1.5–4.5)
Glucose: 90 mg/dL (ref 65–99)
Potassium: 3.9 mmol/L (ref 3.5–5.2)
SODIUM: 145 mmol/L — AB (ref 134–144)
TOTAL PROTEIN: 7.2 g/dL (ref 6.0–8.5)

## 2017-09-17 LAB — CBC
HEMATOCRIT: 43.5 % (ref 37.5–51.0)
Hemoglobin: 14.9 g/dL (ref 13.0–17.7)
MCH: 29.9 pg (ref 26.6–33.0)
MCHC: 34.3 g/dL (ref 31.5–35.7)
MCV: 87 fL (ref 79–97)
PLATELETS: 217 10*3/uL (ref 150–379)
RBC: 4.98 x10E6/uL (ref 4.14–5.80)
RDW: 14.4 % (ref 12.3–15.4)
WBC: 5.6 10*3/uL (ref 3.4–10.8)

## 2017-09-18 LAB — URINE CULTURE

## 2017-09-19 LAB — URINE CYTOLOGY ANCILLARY ONLY
Chlamydia: NEGATIVE
Neisseria Gonorrhea: NEGATIVE

## 2017-09-27 ENCOUNTER — Telehealth: Payer: Self-pay

## 2017-09-27 NOTE — Telephone Encounter (Signed)
Called patient and gave him his results which were all negative per Dr. Sampson GoonFitzgerald.Glennie HawkSimpson, Esbeidy Mclaine R, CMA

## 2017-11-17 ENCOUNTER — Other Ambulatory Visit: Payer: Self-pay

## 2017-11-29 NOTE — Progress Notes (Signed)
Subjective:    Patient ID: Damon Mckee, male    DOB: 02/25/1981, 37 y.o.   MRN: [id number]   CC: Abdominal Pain   HPI: Abdominal Pain  Patient reports continued abdominal pain x3-4 months. States that "it feels like acid reflux" and reports coughing and vomiting after recurrent cough. States he had similar symptoms in the past and a doctor at the clinic gave him a medicine that worked for a while but he stopped being able to afford it and stopped taking it. Denies weight change. Denies blood in stool or urine. Reports some blood in vomit after retching for long periods of time. States he has soft stools and has BM every couple of days, last BM this morning. Patient was seen at recent appointment with Dr. Sampson Goon on 09/16/17 where he had CMP, CBC, UA/urine culture, GC/Chlamydia obtained; all labs negative. Was recommended to take miralax daily. Patient was non compliant stating "I cant sit still that long to wait to go to the bathroom". Patient is very concerned because he has significant family history of colon cancer. States his mother was diagnosed at age 78 and his younger brother recently had a colonoscopy where a polyp was found. Patient does endorse some night sweats "a while back" but denies current night sweats.  Anxiety During visit patient reported constantly that "I'm going to go" stating that since multiple family members have passed away at a young age that "my time is coming". I re-enforced that patient did not have any current medical diagnoses that would limit his lifespan. Patient also reports feeling very stressed as he just received full custody of his children. Patient's children were taken from their mother by CPS and patient received custody. Patient denies SI/HI. States he would never hurt himself but thinks he has an illness that may cause early death like his other family members.   Objective:  BP 100/70 (BP Location: Right Arm, Patient Position: Sitting, Cuff  Size: Normal)   Pulse 64   Temp 98.6 F (37 C) (Oral)   Ht 5\' 5"  (1.651 m)   Wt 147 lb 6.4 oz (66.9 kg)   SpO2 98%   BMI 24.53 kg/m  Vitals and nursing note reviewed  General: well nourished, in no acute distress HEENT: normocephalic,  no scleral icterus or conjunctival pallor, no nasal discharge, moist mucous membranes  Cardiac: RRR, clear S1 and S2, no murmurs, rubs, or gallops Respiratory: clear to auscultation bilaterally, no increased work of breathing Abdomen: soft, diffuse tenderness most significant in LLQ, no rebound tenderness, no guarding, nondistended, no masses or organomegaly. Bowel sounds present Extremities: no edema or cyanosis. Warm, well perfused.   Skin: warm and dry, no rashes noted Neuro: alert and oriented, no focal deficits   Assessment & Plan:    Generalized abdominal pain Unclear etiology at this time however most likely 2/2 GERD. Patient reports similar symptoms in past with relief with medication, per chart review medication was zantac. Will perform work-up for H.pylori as well as referral to GI given significant family history for colon cancer.  -obtain H. Pylori breath test  -zantac 150mg  bid  -referral to GI  -follow up in 1 month if symptoms do not resolve or sooner if worsening or continuing   Anxiety state Suspect multifactorial with stressors including recently getting custody of children and also concerns of family members with diagnosis of colon cancer. PHQ 9 score: 8, somewhat difficult (previous scores of 6 in 2016). GAD 7 score: 8, somewhat difficulty.  Patient denies SI/HI. Patient states that he does not like to talk to counselors, however may consider talking to behavioral health in clinic. Behavioral health unavailable this afternoon, but recommended patient follow up with behavioral health and handout with information on how to schedule appointment given to patient prior to leaving clinic. No SI/HI   -follow up with behavioral health  recommended    Return in about 1 month (around 12/31/2017).   Damon ManisSherin Layaan Mott, DO, PGY-1

## 2017-12-01 ENCOUNTER — Ambulatory Visit: Payer: Medicaid Other | Admitting: Family Medicine

## 2017-12-01 ENCOUNTER — Encounter: Payer: Self-pay | Admitting: Family Medicine

## 2017-12-01 VITALS — BP 100/70 | HR 64 | Temp 98.6°F | Ht 65.0 in | Wt 147.4 lb

## 2017-12-01 DIAGNOSIS — K2100 Gastro-esophageal reflux disease with esophagitis, without bleeding: Secondary | ICD-10-CM

## 2017-12-01 DIAGNOSIS — F411 Generalized anxiety disorder: Secondary | ICD-10-CM | POA: Diagnosis not present

## 2017-12-01 DIAGNOSIS — R1084 Generalized abdominal pain: Secondary | ICD-10-CM | POA: Diagnosis not present

## 2017-12-01 MED ORDER — RANITIDINE HCL 150 MG PO TABS: 150.0000 mg | ORAL_TABLET | Freq: Two times a day (BID) | ORAL | 1 refills | Status: DC

## 2017-12-01 NOTE — Patient Instructions (Addendum)
Gastroesophageal Reflux Disease, Adult Normally, food travels down the esophagus and stays in the stomach to be digested. If a person has gastroesophageal reflux disease (GERD), food and stomach acid move back up into the esophagus. When this happens, the esophagus becomes sore and swollen (inflamed). Over time, GERD can make small holes (ulcers) in the lining of the esophagus. Follow these instructions at home: Diet  Follow a diet as told by your doctor. You may need to avoid foods and drinks such as: ? Coffee and tea (with or without caffeine). ? Drinks that contain alcohol. ? Energy drinks and sports drinks. ? Carbonated drinks or sodas. ? Chocolate and cocoa. ? Peppermint and mint flavorings. ? Garlic and onions. ? Horseradish. ? Spicy and acidic foods, such as peppers, chili powder, curry powder, vinegar, hot sauces, and BBQ sauce. ? Citrus fruit juices and citrus fruits, such as oranges, lemons, and limes. ? Tomato-based foods, such as red sauce, chili, salsa, and pizza with red sauce. ? Fried and fatty foods, such as donuts, french fries, potato chips, and high-fat dressings. ? High-fat meats, such as hot dogs, rib eye steak, sausage, ham, and bacon. ? High-fat dairy items, such as whole milk, butter, and cream cheese.  Eat small meals often. Avoid eating large meals.  Avoid drinking large amounts of liquid with your meals.  Avoid eating meals during the 2-3 hours before bedtime.  Avoid lying down right after you eat.  Do not exercise right after you eat. General instructions  Pay attention to any changes in your symptoms.  Take over-the-counter and prescription medicines only as told by your doctor. Do not take aspirin, ibuprofen, or other NSAIDs unless your doctor says it is okay.  Do not use any tobacco products, including cigarettes, chewing tobacco, and e-cigarettes. If you need help quitting, ask your doctor.  Wear loose clothes. Do not wear anything tight around  your waist.  Raise (elevate) the head of your bed about 6 inches (15 cm).  Try to lower your stress. If you need help doing this, ask your doctor.  If you are overweight, lose an amount of weight that is healthy for you. Ask your doctor about a safe weight loss goal.  Keep all follow-up visits as told by your doctor. This is important. Contact a doctor if:  You have new symptoms.  You lose weight and you do not know why it is happening.  You have trouble swallowing, or it hurts to swallow.  You have wheezing or a cough that keeps happening.  Your symptoms do not get better with treatment.  You have a hoarse voice. Get help right away if:  You have pain in your arms, neck, jaw, teeth, or back.  You feel sweaty, dizzy, or light-headed.  You have chest pain or shortness of breath.  You throw up (vomit) and your throw up looks like blood or coffee grounds.  You pass out (faint).  Your poop (stool) is bloody or black.  You cannot swallow, drink, or eat. This information is not intended to replace advice given to you by your health care provider. Make sure you discuss any questions you have with your health care provider. Document Released: 02/02/2008 Document Revised: 01/22/2016 Document Reviewed: 12/11/2014 Elsevier Interactive Patient Education  [employer]2018 [employer].  It was a pleasure seeing you today.   Today we discussed your abdominal pain  For your abdominal pain: I have referred you to a GI doctor. I have also prescribed zantac to be taken  twice a day. I will call with the H. Pylori test results.   Please follow up in 1 month or sooner if symptoms persist or worsen. Please call the clinic immediately if you have any concerns.   Our clinic's number is [phone]. Please call with questions or concerns.   Please go to the emergency room if you have bleeding in your stool or vomit.   Thank you,  Oralia Manis, DO

## 2017-12-01 NOTE — Assessment & Plan Note (Signed)
Unclear etiology at this time however most likely 2/2 GERD. Patient reports similar symptoms in past with relief with medication, per chart review medication was zantac. Will perform work-up for H.pylori as well as referral to GI given significant family history for colon cancer.  -obtain H. Pylori breath test  -zantac 150mg  bid  -referral to GI  -follow up in 1 month if symptoms do not resolve or sooner if worsening or continuing

## 2017-12-01 NOTE — Assessment & Plan Note (Signed)
Suspect multifactorial with stressors including recently getting custody of children and also concerns of family members with diagnosis of colon cancer. PHQ 9 score: 8, somewhat difficult (previous scores of 6 in 2016). GAD 7 score: 8, somewhat difficulty. Patient denies SI/HI. Patient states that he does not like to talk to counselors, however may consider talking to behavioral health in clinic. Behavioral health unavailable this afternoon, but recommended patient follow up with behavioral health and handout with information on how to schedule appointment given to patient prior to leaving clinic. No SI/HI   -follow up with behavioral health recommended

## 2017-12-03 LAB — H. PYLORI BREATH TEST: H pylori Breath Test: NEGATIVE

## 2017-12-05 ENCOUNTER — Encounter: Payer: Self-pay | Admitting: Family Medicine

## 2017-12-06 ENCOUNTER — Other Ambulatory Visit: Payer: Self-pay | Admitting: Family Medicine

## 2018-02-09 ENCOUNTER — Encounter: Payer: Self-pay | Admitting: Family Medicine

## 2018-06-22 DIAGNOSIS — S20211A Contusion of right front wall of thorax, initial encounter: Secondary | ICD-10-CM | POA: Diagnosis not present

## 2018-06-22 DIAGNOSIS — S0083XA Contusion of other part of head, initial encounter: Secondary | ICD-10-CM | POA: Diagnosis not present

## 2018-06-22 DIAGNOSIS — M79641 Pain in right hand: Secondary | ICD-10-CM | POA: Diagnosis not present

## 2018-06-22 DIAGNOSIS — S0990XA Unspecified injury of head, initial encounter: Secondary | ICD-10-CM | POA: Diagnosis not present

## 2018-06-22 DIAGNOSIS — S60011A Contusion of right thumb without damage to nail, initial encounter: Secondary | ICD-10-CM | POA: Diagnosis not present

## 2018-06-22 DIAGNOSIS — R079 Chest pain, unspecified: Secondary | ICD-10-CM | POA: Diagnosis not present

## 2019-04-09 ENCOUNTER — Ambulatory Visit: Payer: Medicaid Other | Admitting: Family Medicine

## 2019-04-09 ENCOUNTER — Other Ambulatory Visit (HOSPITAL_COMMUNITY)
Admission: RE | Admit: 2019-04-09 | Discharge: 2019-04-09 | Disposition: A | Discharge status: Home / Self Care | Payer: Medicaid Other | Source: Ambulatory Visit | Attending: Family Medicine | Admitting: Family Medicine

## 2019-04-09 ENCOUNTER — Encounter: Payer: Self-pay | Admitting: Family Medicine

## 2019-04-09 ENCOUNTER — Other Ambulatory Visit: Payer: Self-pay

## 2019-04-09 VITALS — BP 118/62 | HR 74 | Wt 155.0 lb

## 2019-04-09 DIAGNOSIS — R361 Hematospermia: Secondary | ICD-10-CM

## 2019-04-09 DIAGNOSIS — Z113 Encounter for screening for infections with a predominantly sexual mode of transmission: Secondary | ICD-10-CM

## 2019-04-09 LAB — POCT URINALYSIS DIP (MANUAL ENTRY)
Bilirubin, UA: NEGATIVE
Blood, UA: NEGATIVE
Glucose, UA: NEGATIVE mg/dL
Ketones, POC UA: NEGATIVE mg/dL
Leukocytes, UA: NEGATIVE
Nitrite, UA: NEGATIVE
Protein Ur, POC: NEGATIVE mg/dL
Spec Grav, UA: 1.02 (ref 1.010–1.025)
Urobilinogen, UA: 0.2 E.U./dL
pH, UA: 7.5 (ref 5.0–8.0)

## 2019-04-09 NOTE — Progress Notes (Signed)
   Subjective:    Patient ID: Damon Mckee, male    DOB: May 19, 1981, 38 y.o.   MRN: [id number]   CC: blood in sperm  HPI: Blood in sperm  Patient noticed blood coming out of his penis 5 days ago.  States that it was after ejaculation.  States that this happened once before last year.  Unsure if this is secondary to alcohol use.  States that he has ejaculated after this 1 event without bleeding.  Denies blood in urination.  Patient is currently sexually active with 1 male partner.  Denies any STD exposure.  Does state that when he was 19 he thinks someone told him he has HPV.  Denies any penile discharge.  Denies any trauma to the area.  Denies any fevers or chills.  Denies any weight changes.  Did report some right-sided testicular pain during the time of bleeding event.  States that he has never had any prostate issues himself but his dad was diagnosed with prostate cancer at age 49.  His mother died of colon cancer.  Objective:  BP 118/62   Pulse 74   Wt 155 lb (70.3 kg)   SpO2 98%   BMI 25.79 kg/m  Vitals and nursing note reviewed  General: well nourished, in no acute distress HEENT: normocephalic, no scleral icterus or conjunctival pallor  Skin: warm and dry, no rashes noted Neuro: alert and oriented, no focal deficits Male genitalia: no penile lesions or discharge no testicular masses no hernia GU exam performed by Dr. McDiarmid with chaperone present  Assessment & Plan:    Hematospermia Patient with hematospermia.  Likely benign condition given that it was only one occurrence this year and one occurrence last year.  Patient has had other ejaculations without blood present.  Can consider prostate cancer given the strong family history.  Will obtain PSA today.  Can also consider STD exposure, will obtain urine testing for this.  Can also consider urinary tract infection.  Will obtain UA.  No trauma.  Strict return precautions given.  Follow-up if no improvement.     Return if symptoms worsen or fail to improve.   Caroline More, DO, PGY-3

## 2019-04-09 NOTE — Assessment & Plan Note (Signed)
Patient with hematospermia.  Likely benign condition given that it was only one occurrence this year and one occurrence last year.  Patient has had other ejaculations without blood present.  Can consider prostate cancer given the strong family history.  Will obtain PSA today.  Can also consider STD exposure, will obtain urine testing for this.  Can also consider urinary tract infection.  Will obtain UA.  No trauma.  Strict return precautions given.  Follow-up if no improvement.

## 2019-04-09 NOTE — Patient Instructions (Signed)
For your blood in sperm, I have ordered labs. You will get a call or letter with results.   If worsening symptoms please follow up.   Dr. Tammi Klippel

## 2019-04-10 LAB — PSA: Prostate Specific Ag, Serum: 1.2 ng/mL (ref 0.0–4.0)

## 2019-04-11 ENCOUNTER — Telehealth: Payer: Self-pay

## 2019-04-11 LAB — URINE CYTOLOGY ANCILLARY ONLY
Chlamydia: NEGATIVE
Neisseria Gonorrhea: NEGATIVE
Trichomonas: NEGATIVE

## 2019-04-11 NOTE — Telephone Encounter (Signed)
Informed patient.  .Michelle R Simpson, CMA  

## 2019-09-18 ENCOUNTER — Encounter: Payer: Self-pay | Admitting: Family Medicine

## 2019-09-18 ENCOUNTER — Other Ambulatory Visit: Payer: Self-pay

## 2019-09-18 ENCOUNTER — Telehealth (INDEPENDENT_AMBULATORY_CARE_PROVIDER_SITE_OTHER): Payer: Medicaid Other | Admitting: Family Medicine

## 2019-09-18 VITALS — Temp 98.8°F

## 2019-09-18 DIAGNOSIS — B356 Tinea cruris: Secondary | ICD-10-CM

## 2019-09-18 DIAGNOSIS — J310 Chronic rhinitis: Secondary | ICD-10-CM

## 2019-09-18 DIAGNOSIS — J069 Acute upper respiratory infection, unspecified: Secondary | ICD-10-CM | POA: Diagnosis not present

## 2019-09-18 DIAGNOSIS — J329 Chronic sinusitis, unspecified: Secondary | ICD-10-CM | POA: Diagnosis not present

## 2019-09-18 MED ORDER — MOMETASONE FUROATE 50 MCG/ACT NA SUSP: 2.0000 | Freq: Every day | NASAL | 2 refills | Status: DC

## 2019-09-18 MED ORDER — CETIRIZINE HCL 10 MG PO TABS: 10.0000 mg | ORAL_TABLET | Freq: Every day | ORAL | 1 refills | Status: DC

## 2019-09-18 MED ORDER — CLOTRIMAZOLE 1 % EX CREA: TOPICAL_CREAM | CUTANEOUS | 0 refills | Status: DC

## 2019-09-18 NOTE — Patient Instructions (Signed)
Looks like you might have allergic rhinitis with viral URI. Please continue OTC cough syrup. Trial of allergy medicine. If no improvement in the next few days or if symptoms worsens, please call. You might need an xray. Otherwise, go to the ED for reassessment.  Above discussed with the patient over the telephone. He agreed with the plan and verbalized understanding.

## 2019-09-18 NOTE — Progress Notes (Signed)
[facility] Telemedicine Visit  Patient consented to have virtual visit. Method of visit: Telephone  Encounter participants: Patient: Damon Mckee - located at [facility] building Provider: Janit Pagan - located at Office Others (if applicable): N/A  Chief Complaint: Sinus  HPI:  Sinusitis This is a new problem. Episode onset: Start about 2 weeks ago, but worsened in the last few day with more mucus production. The problem has been gradually worsening (Occues with season change. ) since onset. There has been no fever. His pain is at a severity of 0/10. Associated symptoms include congestion, coughing and sneezing. Pertinent negatives include no chills, diaphoresis, ear pain, headaches, hoarse voice, shortness of breath, sinus pressure or sore throat. Past treatments include nothing.  Cough This is a new problem. Episode onset: Started about 2 weeks ago when he started with nasal congestion. This is typical of his seasonal allergy. The problem has been unchanged. The cough is non-productive (Mostly nasal congestion). Pertinent negatives include no chills, ear pain, headaches, sore throat or shortness of breath. Exacerbated by: Mostly in the morning when he wakes. Risk factors for lung disease include smoking/tobacco exposure. He has tried OTC cough suppressant for the symptoms. The treatment provided mild relief. There is no history of asthma, COPD or environmental allergies.  Jock itch:Recurring. He used Clotrimazole in the past which help. He will like a refill.   ROS: per HPI  Pertinent PMHx: PMX reviewed  Exam:  Respiratory: Did not sound to be in distress. He could complete full sentences without difficulty.  Assessment/Plan:  Allergic rhinosinusitis: He used Zyrtec in the past. Refill given. Also start Nasonex. Return precaution discussed.  URI: Allergy may be a contributing factor. Treat as above. Continue OTC cough regimen. Return  precaution discussed. Consider xray if no improvement. ED recommended, if his symptoms worsens. Otherwise, stay home and rest with routine COVID-19 precautions.  Jock itch: Refilled antifungal. F/U with PCP soon.    Time spent during visit with patient: 15 minutes

## 2020-03-30 DIAGNOSIS — R102 Pelvic and perineal pain: Secondary | ICD-10-CM | POA: Diagnosis not present

## 2020-03-30 DIAGNOSIS — M25551 Pain in right hip: Secondary | ICD-10-CM | POA: Diagnosis not present

## 2020-03-30 DIAGNOSIS — M25552 Pain in left hip: Secondary | ICD-10-CM | POA: Diagnosis not present

## 2020-03-30 DIAGNOSIS — M542 Cervicalgia: Secondary | ICD-10-CM | POA: Diagnosis not present

## 2020-03-30 DIAGNOSIS — Z043 Encounter for examination and observation following other accident: Secondary | ICD-10-CM | POA: Diagnosis not present

## 2020-03-30 DIAGNOSIS — S0003XA Contusion of scalp, initial encounter: Secondary | ICD-10-CM | POA: Diagnosis not present

## 2020-03-30 DIAGNOSIS — S1091XA Abrasion of unspecified part of neck, initial encounter: Secondary | ICD-10-CM | POA: Diagnosis not present

## 2020-03-30 DIAGNOSIS — M79651 Pain in right thigh: Secondary | ICD-10-CM | POA: Diagnosis not present

## 2020-03-30 DIAGNOSIS — S0083XA Contusion of other part of head, initial encounter: Secondary | ICD-10-CM | POA: Diagnosis not present

## 2020-03-30 DIAGNOSIS — G8911 Acute pain due to trauma: Secondary | ICD-10-CM | POA: Diagnosis not present

## 2021-08-19 NOTE — Progress Notes (Signed)
°  SUBJECTIVE:   CHIEF COMPLAINT / HPI:   Pt seeking STD testing at this time. Pt states his "baby mama" split up with him and was engaging in sexual intercourse with others while they were split apart. He also endorses other sexual partners during that time period. Denies dysuria, discharge, or lesions or sores.   Pt also concerned of cough and mucus production for the last 1-1.5 months. It is worse in the morning. Mucus is green in the morning but gets clear in the day. He feels that the cough is from the mucus. Tried robitussin and benadryl. States he feels like it may be from drinking but unable to further clarify. He states when he coughs, mucus comes up. Notes congestion in his nose as well. No allergies in the past. He does not take any medications regularly. Denies any chest pain, or sore throat or difficulty breathing.   Smokes 1ppd, alcohol use (beers and mixed drinks), 4 drinks/day usually at night. No illicit drug use but does endorse marijuana use. No history of asthma.   He notes that he has vomited due to the mucus production, around 3-4 times a month. No blood in his vomit. Denies being sick at any point in the last couple months.   PERTINENT  PMH / PSH: GERD  OBJECTIVE:  BP (!) 149/99    Pulse 73    Temp 98.5 F (36.9 C) (Oral)    Ht 5\' 5"  (1.651 m)    Wt 154 lb (69.9 kg)    SpO2 100%    BMI 25.63 kg/m   General: NAD, pleasant, able to participate in exam HEENT: b/l Tms non-erythematous and non-bulging, no nares patent and erythematous, no cervical adenopathy, moist mucous membranes, without pharyngeal erythema Cardiac: RRR, no murmurs. Respiratory: CTAB, normal effort, No wheezes, rales or rhonchi   ASSESSMENT/PLAN:  Screening for STDs (sexually transmitted diseases) Screening for GC, Chlamydia, Trich, Syphilis, HIV. Discussed PrEP with patient in the setting of multiple sexual partners. Advised regular and consistent condom use.   Cough in adult Differential includes  viral illness, GERD, allergies, new onset COPD 2/2 cigarette use, postnasal drip. Suspect mucus cough cycle in the setting of post-viral syndrome further exacerbated by consistent smoke exposure. Advise Flonase to each nare daily in addition to dextromethorphane to aid in cough cessation. Pt amenable to plan. Return in 4 weeks for follow up. Consider CXR at that time if not resolved. Reassuring that there is no breathing difficulty at this time.  Tobacco abuse Advised smoking cessation given current cough and congestion. Patient not yet ready to quit. Offered resources in the instance that patient should want to quit in the future.   Return in about 4 weeks (around 09/17/2021) for cough/congestion follow up.  09/19/2021, DO 08/20/2021, 11:27 PM PGY-1, [facility]

## 2021-08-20 ENCOUNTER — Ambulatory Visit (INDEPENDENT_AMBULATORY_CARE_PROVIDER_SITE_OTHER): Payer: Medicaid Other | Admitting: Student

## 2021-08-20 ENCOUNTER — Encounter: Payer: Self-pay | Admitting: Student

## 2021-08-20 ENCOUNTER — Other Ambulatory Visit: Payer: Self-pay

## 2021-08-20 ENCOUNTER — Other Ambulatory Visit (HOSPITAL_COMMUNITY)
Admission: RE | Admit: 2021-08-20 | Discharge: 2021-08-20 | Disposition: A | Discharge status: Home / Self Care | Payer: Medicaid Other | Source: Ambulatory Visit | Attending: Family Medicine | Admitting: Family Medicine

## 2021-08-20 VITALS — BP 149/99 | HR 73 | Temp 98.5°F | Ht 65.0 in | Wt 154.0 lb

## 2021-08-20 DIAGNOSIS — I10 Essential (primary) hypertension: Secondary | ICD-10-CM | POA: Insufficient documentation

## 2021-08-20 DIAGNOSIS — Z113 Encounter for screening for infections with a predominantly sexual mode of transmission: Secondary | ICD-10-CM

## 2021-08-20 DIAGNOSIS — Z720 Tobacco use: Secondary | ICD-10-CM

## 2021-08-20 DIAGNOSIS — R059 Cough, unspecified: Secondary | ICD-10-CM | POA: Diagnosis not present

## 2021-08-20 MED ORDER — DEXTROMETHORPHAN HBR 15 MG/5ML PO SYRP: 10.0000 mL | ORAL_SOLUTION | Freq: Four times a day (QID) | ORAL | 1 refills | Status: DC | PRN

## 2021-08-20 MED ORDER — FLUTICASONE PROPIONATE 50 MCG/ACT NA SUSP: 2.0000 | Freq: Every day | NASAL | 1 refills | Status: DC
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
## 2021-08-20 NOTE — Assessment & Plan Note (Addendum)
Differential includes viral illness, GERD, allergies, new onset COPD 2/2 cigarette use, postnasal drip. Suspect mucus cough cycle in the setting of post-viral syndrome further exacerbated by consistent smoke exposure. Advise Flonase to each nare daily in addition to dextromethorphane to aid in cough cessation. Pt amenable to plan. Return in 4 weeks for follow up. Consider CXR at that time if not resolved. Reassuring that there is no breathing difficulty at this time.

## 2021-08-20 NOTE — Patient Instructions (Signed)
It was great to see you today! Thank you for choosing [facility] for your primary care. Damon Mckee was seen for STD check and cough/congestion.  Our plans for today were:  - STD check: we are checking for gonorrhea/chlamydia/trichomonas/syphilis/HIV. -Cough and congestion: I prescribed Flonase and dextromethorphan to help with your congestion and cough.  Please take these as prescribed.  If these do not help, we can consider a chest x-ray at your follow-up visit in 4 weeks.  We are checking some labs today. If they are abnormal, I will call you. If they are normal, I will send you a MyChart message or a letter. If you do not hear about your labs in the next 2 weeks, please let us know.  You should return to our clinic in 4 weeks for cough and congestion follow up.   I recommend that you always bring your medications to each appointment as this makes it easy to ensure you are on the correct medications and helps [place] not miss refills when you need them.  Please arrive 15 minutes before your appointment to ensure smooth check in process.  We appreciate your efforts in making this happen.  Take care and seek immediate care sooner if you develop any concerns.   Thank you for allowing me to participate in your care, Shelby Mattocks, DO 08/20/2021, 4:08 PM PGY-1, [facility]

## 2021-08-20 NOTE — Assessment & Plan Note (Signed)
Advised smoking cessation given current cough and congestion. Patient not yet ready to quit. Offered resources in the instance that patient should want to quit in the future.

## 2021-08-21 LAB — HIV ANTIBODY (ROUTINE TESTING W REFLEX): HIV Screen 4th Generation wRfx: NONREACTIVE

## 2021-08-21 LAB — URINE CYTOLOGY ANCILLARY ONLY
Chlamydia: NEGATIVE
Comment: NEGATIVE
Comment: NEGATIVE
Comment: NORMAL
Neisseria Gonorrhea: NEGATIVE
Trichomonas: NEGATIVE

## 2021-08-21 LAB — RPR: RPR Ser Ql: NONREACTIVE

## 2021-08-24 ENCOUNTER — Telehealth: Payer: Self-pay | Admitting: Student

## 2021-08-24 NOTE — Telephone Encounter (Signed)
Unable to reach patient via phone. Left HIPPA-compliant VM. Unremarkable results, will attempt to reach again at a later time.

## 2021-08-25 ENCOUNTER — Telehealth: Payer: Self-pay | Admitting: Family Medicine

## 2021-08-25 NOTE — Telephone Encounter (Signed)
Previous note was opened in error w/o a message. Pt. walked in stating the doctor had called him.  He is requesting the doctor call him again.

## 2021-08-26 NOTE — Telephone Encounter (Signed)
Patient returned call.  Parnell Spieler,CMA  

## 2021-08-27 ENCOUNTER — Telehealth: Payer: Self-pay | Admitting: Student

## 2021-08-27 NOTE — Telephone Encounter (Signed)
Pt confirmed date of birth over the phone. Reviewed negative STD results with pt.

## 2021-09-14 ENCOUNTER — Ambulatory Visit: Payer: Medicaid Other | Admitting: Family Medicine

## 2021-10-02 ENCOUNTER — Ambulatory Visit (INDEPENDENT_AMBULATORY_CARE_PROVIDER_SITE_OTHER): Payer: Medicaid Other | Admitting: Family Medicine

## 2021-10-02 ENCOUNTER — Other Ambulatory Visit: Payer: Self-pay

## 2021-10-02 ENCOUNTER — Encounter: Payer: Self-pay | Admitting: Family Medicine

## 2021-10-02 VITALS — BP 120/70 | HR 74 | Ht 65.0 in | Wt 152.0 lb

## 2021-10-02 DIAGNOSIS — R0981 Nasal congestion: Secondary | ICD-10-CM

## 2021-10-02 DIAGNOSIS — R059 Cough, unspecified: Secondary | ICD-10-CM | POA: Diagnosis not present

## 2021-10-02 NOTE — Progress Notes (Signed)
° ° °  SUBJECTIVE:   CHIEF COMPLAINT / HPI:   [name] is a 41 yo who presents for follow up on his cough and congestion from his visit on 12/22.   States he still has a lot of mucus production in the back of his throat for the past couple of months which causes him to cough. Worse in the morning. Denies SOB. Does not take medications for it. He states the alcohol may be contributing as he drinks about 4 drinks a day typically at night. He was advised to start Flonase at the last visit which he declines taking.   Endorses smoking 1/2 a pack  to a pack a day. Not quite ready to quit    OBJECTIVE:   BP 120/70    Pulse 74    Ht 5\' 5"  (1.651 m)    Wt 152 lb (68.9 kg)    SpO2 99%    BMI 25.29 kg/m    Physical exam  General: well appearing, NAD HEENT: PERRL. Normal conjunctiva. MMM. Oropharynx non erythematous and without exudates  Cardiovascular: RRR, no murmurs Lungs: CTAB. Normal WOB Abdomen: soft, non-distended, non-tender Skin: warm, dry. No edema  ASSESSMENT/PLAN:   No problem-specific Assessment & Plan notes found for this encounter.   Cough and congestion Likely due to post nasal drip vs post viral. Physical exam unremarkable. VSS and he remains afebrile. Discussed how tobacco use is also likely contributing. Advised to use Flonase/Nasocort and discussed proper use of it. Also recommended adding Zyrtec if symptoms not relieved with Flonase. Return precautions discussed.   Tobacco use  Currently smoking 1/2-1 PPD. Discussed tobacco cessation though he is in the pre-contemplative stage and not ready to quit. Advised to let know if and when he is and we can provide him with resources/discuss medication.   [place], DO [facility]

## 2021-10-02 NOTE — Patient Instructions (Signed)
It was great seeing you today!  You came in for increased mucus production and cough, and I do recommend taking the Flonase/Nasacort daily as we talked about.  You can also add a daily Zyrtec if you are still not getting relief.  We discussed cutting down your tobacco use with a goal of quitting which should be helpful as well.  Id like to see you back in about 3 months for your physical,  but if you need to be seen earlier than that for any new issues we're happy to fit you in, just give [place] a call!  Feel free to call with any questions or concerns at any time, at [phone].   Take care,  Dr. Cora Collum [facility]

## 2021-12-22 ENCOUNTER — Encounter (HOSPITAL_BASED_OUTPATIENT_CLINIC_OR_DEPARTMENT_OTHER): Payer: Self-pay

## 2021-12-22 ENCOUNTER — Other Ambulatory Visit: Payer: Self-pay

## 2021-12-22 ENCOUNTER — Emergency Department (HOSPITAL_BASED_OUTPATIENT_CLINIC_OR_DEPARTMENT_OTHER)
Admission: EM | Admit: 2021-12-22 | Discharge: 2021-12-22 | Disposition: A | Discharge status: Home / Self Care | Payer: Medicaid Other | Attending: Emergency Medicine | Admitting: Emergency Medicine

## 2021-12-22 DIAGNOSIS — Z23 Encounter for immunization: Secondary | ICD-10-CM | POA: Diagnosis not present

## 2021-12-22 DIAGNOSIS — S0993XA Unspecified injury of face, initial encounter: Secondary | ICD-10-CM | POA: Diagnosis present

## 2021-12-22 DIAGNOSIS — W500XXA Accidental hit or strike by another person, initial encounter: Secondary | ICD-10-CM | POA: Diagnosis not present

## 2021-12-22 DIAGNOSIS — S01411A Laceration without foreign body of right cheek and temporomandibular area, initial encounter: Secondary | ICD-10-CM | POA: Insufficient documentation

## 2021-12-22 DIAGNOSIS — S0181XA Laceration without foreign body of other part of head, initial encounter: Secondary | ICD-10-CM | POA: Diagnosis not present

## 2021-12-22 DIAGNOSIS — S01412A Laceration without foreign body of left cheek and temporomandibular area, initial encounter: Secondary | ICD-10-CM | POA: Diagnosis not present

## 2021-12-22 MED ORDER — ACETAMINOPHEN 325 MG PO TABS
650.0000 mg | ORAL_TABLET | Freq: Once | ORAL | Status: AC
  Administered 2021-12-22: 650 mg via ORAL
  Filled 2021-12-22: qty 2

## 2021-12-22 MED ORDER — TETANUS-DIPHTH-ACELL PERTUSSIS 5-2.5-18.5 LF-MCG/0.5 IM SUSY
0.5000 mL | PREFILLED_SYRINGE | Freq: Once | INTRAMUSCULAR | Status: AC
  Administered 2021-12-22: 0.5 mL via INTRAMUSCULAR
  Filled 2021-12-22: qty 0.5

## 2021-12-22 NOTE — ED Notes (Signed)
RN provided AVS using Teachback Method. Patient verbalizes understanding of Discharge Instructions. Opportunity for Questioning and Answers were provided by RN. Patient Discharged from ED ambulatory to Home. ° °

## 2021-12-22 NOTE — Discharge Instructions (Addendum)
Keep dry over the next 24 hours. ? ?Glue will eventually fall off ? ?Return for any new or worsening symptoms ?
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
## 2021-12-23 ENCOUNTER — Telehealth: Payer: Self-pay

## 2021-12-23 NOTE — Telephone Encounter (Signed)
Transition Care Management Unsuccessful Follow-up Telephone Call ? ?Date of discharge and from where:  12/22/2021 from [facility] ? ?Attempts:  1st Attempt ? ?Reason for unsuccessful TCM follow-up call:  Left voice message ? ? ? ?

## 2021-12-24 NOTE — Telephone Encounter (Signed)
Transition Care Management Unsuccessful Follow-up Telephone Call ? ?Date of discharge and from where:  [facility] ? ?Attempts:  2nd Attempt ? ?Reason for unsuccessful TCM follow-up call:  Left voice message ? ?  ?

## 2021-12-25 NOTE — Telephone Encounter (Signed)
Transition Care Management Unsuccessful Follow-up Telephone Call ? ?Date of discharge and from where:  [facility]22/[facility] ? ?Attempts:  3rd Attempt ? ?Reason for unsuccessful TCM follow-up call:  Unable to reach patient ? ? ? ?

## 2022-04-26 ENCOUNTER — Ambulatory Visit: Payer: Medicaid Other | Admitting: Family Medicine

## 2022-04-27 ENCOUNTER — Encounter: Payer: Self-pay | Admitting: Family Medicine

## 2022-04-27 ENCOUNTER — Other Ambulatory Visit (HOSPITAL_COMMUNITY)
Admission: RE | Admit: 2022-04-27 | Discharge: 2022-04-27 | Disposition: A | Discharge status: Home / Self Care | Payer: Medicaid Other | Source: Ambulatory Visit | Attending: Family Medicine | Admitting: Family Medicine

## 2022-04-27 ENCOUNTER — Ambulatory Visit (INDEPENDENT_AMBULATORY_CARE_PROVIDER_SITE_OTHER): Payer: Medicaid Other | Admitting: Family Medicine

## 2022-04-27 VITALS — BP 158/93 | HR 74 | Ht 65.0 in | Wt 154.2 lb

## 2022-04-27 DIAGNOSIS — I10 Essential (primary) hypertension: Secondary | ICD-10-CM | POA: Diagnosis not present

## 2022-04-27 DIAGNOSIS — Z113 Encounter for screening for infections with a predominantly sexual mode of transmission: Secondary | ICD-10-CM | POA: Diagnosis not present

## 2022-04-27 MED ORDER — AMLODIPINE BESYLATE 2.5 MG PO TABS: 2.5000 mg | ORAL_TABLET | Freq: Every day | ORAL | 3 refills | Status: DC

## 2022-04-27 NOTE — Patient Instructions (Signed)
It was great seeing you today!  Today we discussed your high blood pressure, I am worried that if this continues to worsen then you can get a stroke. I have prescribed amlodipine 2.5 mg, please take this daily. Also record your blood pressures twice daily and bring this log into your next visit.   Regarding your STD screening, I will let you know of any abnormal results including the blood work.   Please follow up at your next scheduled appointment in 1-2 weeks, if anything arises between now and then, please don't hesitate to contact our office.   Thank you for allowing [place] to be a part of your medical care!  Thank you, Dr. Robyne Peers

## 2022-04-27 NOTE — Progress Notes (Signed)
    SUBJECTIVE:   CHIEF COMPLAINT / HPI:   Patient presents with concerns regarding blood pressure, he has been checking his BP periodically at home and it seems to be around systolic 150s and diastolic 90s. Denies chest pain, dyspnea, headaches, vision changes and leg swelling. Has never been prescribed antihypertensive therapy. Denies any personal stressors although admits that he is sometimes nervous when he comes to the doctor's but later denies this.   Also desires STD testing today. Sexually active with 1 male partner who has other partners. Endorses a watery clear penile discharge that he has noticed in his boxers. This has been ongoing for about a week. Denies rash, dysuria or other symptoms.   OBJECTIVE:   BP (!) 158/93   Pulse 74   Ht 5\' 5"  (1.651 m)   Wt 154 lb 3.2 oz (69.9 kg)   SpO2 100%   BMI 25.66 kg/m   General: Patient well-appearing, in no acute distress. CV: RRR, no murmurs or gallops auscultated Resp: CTAB, no wheezing, rales or rhonchi noted Ext: radial pulses strong and equal bilaterally, no LE edema noted bilaterally  Psych: mood appropriate, pleasant   ASSESSMENT/PLAN:   Hypertension -BP 158/93 and remained elevated upon recheck, uncontrolled -given elevated BP readings today in clinic along with sustaining high systolics at home, started amlodipine 2.5 mg daily -instructed to check BP twice daily and bring BP log to next visit -reassuringly remains asymptomatic, strict ED precautions discussed  -follow up in 1-2 weeks for BP recheck   Screening examination for STD (sexually transmitted disease) -pending urine cytology -patient also agreeable to blood testing: RPR and HIV ordered -safe sex counseling     -PHQ-9 score of 7 with negative question 9 reviewed.     , DO [facility]

## 2022-04-27 NOTE — Assessment & Plan Note (Signed)
-  pending urine cytology -patient also agreeable to blood testing: RPR and HIV ordered -safe sex counseling

## 2022-04-27 NOTE — Assessment & Plan Note (Signed)
-  BP 158/93 and remained elevated upon recheck, uncontrolled -given elevated BP readings today in clinic along with sustaining high systolics at home, started amlodipine 2.5 mg daily -instructed to check BP twice daily and bring BP log to next visit -reassuringly remains asymptomatic, strict ED precautions discussed  -follow up in 1-2 weeks for BP recheck

## 2022-04-28 ENCOUNTER — Other Ambulatory Visit: Payer: Self-pay | Admitting: Family Medicine

## 2022-04-28 DIAGNOSIS — A549 Gonococcal infection, unspecified: Secondary | ICD-10-CM

## 2022-04-28 LAB — URINE CYTOLOGY ANCILLARY ONLY
Chlamydia: NEGATIVE
Comment: NEGATIVE
Comment: NEGATIVE
Comment: NORMAL
Neisseria Gonorrhea: POSITIVE — AB
Trichomonas: NEGATIVE

## 2022-04-28 MED ORDER — CEFIXIME 400 MG PO CAPS: 800.0000 mg | ORAL_CAPSULE | Freq: Every day | ORAL | 0 refills | Status: AC

## 2022-04-29 LAB — RPR W/REFLEX TO TREPSURE: RPR: NONREACTIVE

## 2022-04-29 LAB — T PALLIDUM ANTIBODY, EIA: T pallidum Antibody, EIA: NEGATIVE

## 2022-04-29 LAB — HIV ANTIBODY (ROUTINE TESTING W REFLEX): HIV Screen 4th Generation wRfx: NONREACTIVE

## 2022-05-06 ENCOUNTER — Encounter: Payer: Self-pay | Admitting: Student

## 2022-05-06 ENCOUNTER — Telehealth: Payer: Self-pay

## 2022-05-06 ENCOUNTER — Ambulatory Visit (INDEPENDENT_AMBULATORY_CARE_PROVIDER_SITE_OTHER): Payer: Medicaid Other | Admitting: Student

## 2022-05-06 VITALS — BP 106/60 | HR 78 | Wt 153.0 lb

## 2022-05-06 DIAGNOSIS — I10 Essential (primary) hypertension: Secondary | ICD-10-CM | POA: Diagnosis not present

## 2022-05-06 NOTE — Progress Notes (Signed)
   Established Patient Office Visit  Subjective   Patient ID: Damon Mckee, male    DOB: 27-Feb-1981  Age: 41 y.o. MRN: [id number]  Chief Complaint  Patient presents with   Hypertension   Hypertension Reports that he is here for blood pressure recheck.  He started on Norvasc 2.5 mg 2 weeks ago.  He has been checking his blood pressure daily, these pressures are good in the 130s/80s.  Occasional blood pressures elevated above 140 systolic. We discussed the possibility of going up on the medication for better control, however he is not interested at this time.  He has no acute concerns, no symptoms at this time, denies medication side effects.  All ROS negative except for what is mentioned above.    Objective:     BP 106/60   Pulse 78   Wt 153 lb (69.4 kg)   SpO2 98%   BMI 25.46 kg/m  BP Readings from Last 3 Encounters:  05/06/22 106/60  04/27/22 (!) 158/93  12/22/21 (!) 141/96    Physical Exam Constitutional:      General: He is not in acute distress.    Appearance: Normal appearance. He is not ill-appearing.  HENT:     Head: Normocephalic and atraumatic.     Nose: Nose normal. No congestion.  Eyes:     Extraocular Movements: Extraocular movements intact.     Conjunctiva/sclera: Conjunctivae normal.  Cardiovascular:     Rate and Rhythm: Normal rate and regular rhythm.     Pulses: Normal pulses.     Heart sounds: Normal heart sounds.  Pulmonary:     Effort: Pulmonary effort is normal. No respiratory distress.     Breath sounds: Normal breath sounds. No wheezing.  Abdominal:     General: Abdomen is flat. Bowel sounds are normal.     Palpations: Abdomen is soft.     Tenderness: There is no abdominal tenderness.  Musculoskeletal:     Cervical back: Normal range of motion and neck supple.  Skin:    General: Skin is warm and dry.  Neurological:     Mental Status: He is alert.       Assessment & Plan:   Problem List Items Addressed This Visit        Cardiovascular and Mediastinum   Hypertension - Primary    High blood pressure, well controlled on Norvasc 2.5 mg. Has not had CMP in 3 years. - Continue Norvasc 2.5 mg - CMP      Relevant Orders   Comprehensive metabolic panel     Tiffany Kocher, DO

## 2022-05-06 NOTE — Patient Instructions (Addendum)
It was great to see you! Thank you for allowing me to participate in your care!   I recommend that you always bring your medications to each appointment as this makes it easy to ensure we are on the correct medications and helps [place] not miss when refills are needed.  Our plans for today:  - Keep taking your amlodipine (norvasc) 2.5 mg once daily  We checked some lab-work today. We will call you with results! If you do not hear from [place] in 1 week, please call.  Take care and seek immediate care sooner if you develop any concerns. Please remember to show up 15 minutes before your scheduled appointment time!  Tiffany Kocher, DO [facility]

## 2022-05-06 NOTE — Assessment & Plan Note (Signed)
High blood pressure, well controlled on Norvasc 2.5 mg. Has not had CMP in 3 years. - Continue Norvasc 2.5 mg - CMP

## 2022-05-06 NOTE — Telephone Encounter (Signed)
-----   Message from Jennette Bill, CMA sent at 05/04/2022  1:55 PM EDT ----- Regarding: STI reporting Pos GC

## 2022-05-06 NOTE — Telephone Encounter (Signed)
STI FORM FAXED TO [facility]. Aquilla Solian, CMA

## 2022-05-06 NOTE — Progress Notes (Signed)
Pt is scheduled to come in today to see Dr.Brunson at 1:45 pm

## 2022-05-07 LAB — COMPREHENSIVE METABOLIC PANEL
ALT: 20 IU/L (ref 0–44)
AST: 20 IU/L (ref 0–40)
Albumin/Globulin Ratio: 2.3 — ABNORMAL HIGH (ref 1.2–2.2)
Albumin: 5.1 g/dL (ref 4.1–5.1)
Alkaline Phosphatase: 77 IU/L (ref 44–121)
BUN/Creatinine Ratio: 14 (ref 9–20)
BUN: 10 mg/dL (ref 6–24)
Bilirubin Total: 0.4 mg/dL (ref 0.0–1.2)
CO2: 21 mmol/L (ref 20–29)
Calcium: 9.9 mg/dL (ref 8.7–10.2)
Chloride: 103 mmol/L (ref 96–106)
Creatinine, Ser: 0.74 mg/dL — ABNORMAL LOW (ref 0.76–1.27)
Globulin, Total: 2.2 g/dL (ref 1.5–4.5)
Glucose: 92 mg/dL (ref 70–99)
Potassium: 4.2 mmol/L (ref 3.5–5.2)
Sodium: 143 mmol/L (ref 134–144)
Total Protein: 7.3 g/dL (ref 6.0–8.5)
eGFR: 117 mL/min/{1.73_m2} (ref >59)

## 2022-05-10 NOTE — Progress Notes (Signed)
Pt is scheduled to come in 09/29 @11 :10 a.m

## 2022-05-28 ENCOUNTER — Ambulatory Visit: Payer: Medicaid Other | Admitting: Family Medicine

## 2022-06-04 ENCOUNTER — Encounter: Payer: Self-pay | Admitting: Family Medicine

## 2022-06-04 ENCOUNTER — Ambulatory Visit (INDEPENDENT_AMBULATORY_CARE_PROVIDER_SITE_OTHER): Payer: Medicaid Other | Admitting: Family Medicine

## 2022-06-04 ENCOUNTER — Other Ambulatory Visit: Payer: Self-pay

## 2022-06-04 ENCOUNTER — Other Ambulatory Visit (HOSPITAL_COMMUNITY)
Admission: RE | Admit: 2022-06-04 | Discharge: 2022-06-04 | Disposition: A | Discharge status: Home / Self Care | Payer: Medicaid Other | Source: Ambulatory Visit | Attending: Family Medicine | Admitting: Family Medicine

## 2022-06-04 VITALS — BP 132/92 | HR 69 | Wt 156.4 lb

## 2022-06-04 DIAGNOSIS — Z202 Contact with and (suspected) exposure to infections with a predominantly sexual mode of transmission: Secondary | ICD-10-CM | POA: Insufficient documentation

## 2022-06-04 DIAGNOSIS — M542 Cervicalgia: Secondary | ICD-10-CM

## 2022-06-04 DIAGNOSIS — I10 Essential (primary) hypertension: Secondary | ICD-10-CM | POA: Diagnosis not present

## 2022-06-04 DIAGNOSIS — Z113 Encounter for screening for infections with a predominantly sexual mode of transmission: Secondary | ICD-10-CM

## 2022-06-04 MED ORDER — AMLODIPINE BESYLATE 5 MG PO TABS: 5.0000 mg | ORAL_TABLET | Freq: Every day | ORAL | 3 refills | Status: DC
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
## 2022-06-04 NOTE — Patient Instructions (Signed)
It was great seeing you today!  Today we increased your amlodipine from 2.5 mg to 5 mg daily  We are also checking your urine to ensure you have cleared your infection. I will call you if anything is abnormal or will send you a mychart message if normal   Feel free to call with any questions or concerns at any time, at [phone].   Take care,  Dr. Shary Key [facility]

## 2022-06-05 DIAGNOSIS — M542 Cervicalgia: Secondary | ICD-10-CM | POA: Insufficient documentation
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
## 2022-06-05 NOTE — Assessment & Plan Note (Signed)
Tested positive for chlamydia 8/29 and desires repeat testing. Asymptomatic - urine cytology pending

## 2022-06-05 NOTE — Assessment & Plan Note (Signed)
BP 132/92. Patient has had home BP measurements above 130/80. Will increase Amlodipine from 2.5mg  to 5mg  daily.

## 2022-06-07 LAB — URINE CYTOLOGY ANCILLARY ONLY
Chlamydia: NEGATIVE
Comment: NEGATIVE
Comment: NORMAL
Neisseria Gonorrhea: NEGATIVE

## 2022-07-09 ENCOUNTER — Other Ambulatory Visit (HOSPITAL_COMMUNITY)
Admission: RE | Admit: 2022-07-09 | Discharge: 2022-07-09 | Disposition: A | Discharge status: Home / Self Care | Payer: Medicaid Other | Source: Ambulatory Visit | Attending: Family Medicine | Admitting: Family Medicine

## 2022-07-09 ENCOUNTER — Ambulatory Visit (INDEPENDENT_AMBULATORY_CARE_PROVIDER_SITE_OTHER): Payer: Medicaid Other | Admitting: Student

## 2022-07-09 ENCOUNTER — Encounter: Payer: Self-pay | Admitting: Student

## 2022-07-09 VITALS — BP 132/82 | HR 73 | Ht 65.0 in | Wt 158.0 lb

## 2022-07-09 DIAGNOSIS — Z720 Tobacco use: Secondary | ICD-10-CM | POA: Diagnosis not present

## 2022-07-09 DIAGNOSIS — Z113 Encounter for screening for infections with a predominantly sexual mode of transmission: Secondary | ICD-10-CM | POA: Insufficient documentation

## 2022-07-09 DIAGNOSIS — Z1211 Encounter for screening for malignant neoplasm of colon: Secondary | ICD-10-CM | POA: Diagnosis not present
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
## 2022-07-09 NOTE — Patient Instructions (Signed)
Great to meet you.  We collected testing today- I will call you if it is abnormal. If normal, I will send a MyChart message.  I placed a referral to GI for a colonoscopy. They should call you to schedule in 2 weeks. If not, please call us.  Dr. Melissa Noon

## 2022-07-12 LAB — URINE CYTOLOGY ANCILLARY ONLY
Chlamydia: NEGATIVE
Comment: NEGATIVE
Comment: NEGATIVE
Comment: NORMAL
Neisseria Gonorrhea: NEGATIVE
Trichomonas: NEGATIVE

## 2022-07-13 NOTE — Assessment & Plan Note (Signed)
Currently still smoking 1/2 pack/day.  Patient not ready to quit yet. Discussed setting up appointment with Dr. Hildred Laser for cessation resources. Patient said that he will consider this and let us know. Provided with 1 [phone] quit NOW phone number.

## 2022-07-13 NOTE — Assessment & Plan Note (Signed)
Collected urine cytology-pending. Discussed safe sex practices.

## 2022-10-11 ENCOUNTER — Telehealth: Payer: Self-pay

## 2022-10-11 ENCOUNTER — Encounter: Payer: Self-pay | Admitting: Gastroenterology

## 2022-10-11 NOTE — Telephone Encounter (Signed)
Patient LVM on nurse line reporting chest pains.   I called the patient back, however no answer or option for VM.

## 2022-11-16 ENCOUNTER — Encounter: Payer: Self-pay | Admitting: Gastroenterology

## 2022-11-16 ENCOUNTER — Ambulatory Visit (INDEPENDENT_AMBULATORY_CARE_PROVIDER_SITE_OTHER): Payer: Medicaid Other | Admitting: Gastroenterology

## 2022-11-16 VITALS — BP 126/82 | HR 75 | Ht 65.0 in | Wt 160.0 lb

## 2022-11-16 DIAGNOSIS — K219 Gastro-esophageal reflux disease without esophagitis: Secondary | ICD-10-CM

## 2022-11-16 DIAGNOSIS — Z800 Family history of malignant neoplasm of digestive organs: Secondary | ICD-10-CM

## 2022-11-16 DIAGNOSIS — R1319 Other dysphagia: Secondary | ICD-10-CM | POA: Diagnosis not present

## 2022-11-16 MED ORDER — NA SULFATE-K SULFATE-MG SULF 17.5-3.13-1.6 GM/177ML PO SOLN: 1.0000 | Freq: Once | ORAL | 0 refills | Status: AC

## 2022-11-16 NOTE — Patient Instructions (Signed)
_______________________________________________________  If your blood pressure at your visit was 140/90 or greater, please contact your primary care physician to follow up on this.  _______________________________________________________  If you are age 41 or older, your body mass index should be between 23-30. Your Body mass index is 26.63 kg/m. If this is out of the aforementioned range listed, please consider follow up with your Primary Care Provider.  If you are age 26 or younger, your body mass index should be between 19-25. Your Body mass index is 26.63 kg/m. If this is out of the aformentioned range listed, please consider follow up with your Primary Care Provider.   You have been scheduled for an endoscopy and colonoscopy. Please follow the written instructions given to you at your visit today. Please pick up your prep supplies at the pharmacy within the next 1-3 days. If you use inhalers (even only as needed), please bring them with you on the day of your procedure.   The [place] [facility] providers would like to encourage you to use [facility] to communicate with providers for non-urgent requests or questions.  Due to long hold times on the telephone, sending your provider a message by [facility] may be a faster and more efficient way to get a response.  Please allow 48 business hours for a response.  Please remember that this is for non-urgent requests.   It was a pleasure to see you today!  Thank you for trusting me with your gastrointestinal care!

## 2022-11-16 NOTE — Progress Notes (Signed)
HPI : Damon Mckee is a very pleasant 42 year old male who is referred to [place] by Dr. Chrisandra Netters for early colon cancer screening.  The patient's mother was diagnosed with colon cancer at age 27 and passed away about 2 years later.  He denies any other family history of colon cancer.  He denies any chronic lower GI symptoms such as constipation, diarrhea or blood in the stool.  He has regular bowel movements with formed brown stool daily. He does report chronic intermittent bloating abdominal discomfort which has been bothering him for many years (even when he was a kid).  He also reports frequent symptoms of burning irritation in his throat, usually when he awakens and the sensation that his throat is closing up.  He has had the sensation of food getting stuck in his chest and taking a long time to go down, but has never had to forcefully vomit stuck food.  He does not take any medications for these symptoms because he 'is not big on taking medications'. His weight has been stable.   Past Medical History:  Diagnosis Date   Allergy    GERD (gastroesophageal reflux disease)      History reviewed. No pertinent surgical history. Family History  Problem Relation Age of Onset   Cancer Mother 60       Colon Cancer (Stage IV)   Diabetes Mother    Hypertension Mother    Cancer Father 60       Prostate Cancer   Diabetes Father    Hypertension Father    Alcohol abuse Father    Rectal cancer Neg Hx    Liver cancer Neg Hx    Esophageal cancer Neg Hx    Stomach cancer Neg Hx    Social History   Tobacco Use   Smoking status: Every Day    Packs/day: .5    Types: Cigarettes    Passive exposure: Current   Smokeless tobacco: Never  Vaping Use   Vaping Use: Never used  Substance Use Topics   Alcohol use: Yes    Comment: every other week   Drug use: No   Current Outpatient Medications  Medication Sig Dispense Refill   amLODipine (NORVASC) 5 MG tablet Take 1 tablet (5 mg  total) by mouth at bedtime. 90 tablet 3   No current facility-administered medications for this visit.   No Known Allergies   Review of Systems: All systems reviewed and negative except where noted in HPI.    No results found.  Physical Exam: BP 126/82   Pulse 75   Ht 5\' 5"  (1.651 m)   Wt 160 lb (72.6 kg)   SpO2 95%   BMI 26.63 kg/m  Constitutional: Pleasant,well-developed, Hispanic male in no acute distress. HEENT: Normocephalic and atraumatic. Conjunctivae are normal. No scleral icterus. Neck supple.  Cardiovascular: Normal rate, regular rhythm.  Pulmonary/chest: Effort normal and breath sounds normal. No wheezing, rales or rhonchi. Abdominal: Soft, nondistended, nontender. Bowel sounds active throughout. There are no masses palpable. No hepatomegaly. Extremities: no edema Neurological: Alert and oriented to person place and time. Skin: Skin is warm and dry. No rashes noted. Psychiatric: Normal mood and affect. Behavior is normal.  CBC    Component Value Date/Time   WBC 5.6 09/16/2017 1629   WBC 4.0 05/01/2014 1426   RBC 4.98 09/16/2017 1629   RBC 4.88 05/01/2014 1426   HGB 14.9 09/16/2017 1629   HCT 43.5 09/16/2017 1629   PLT 217 09/16/2017 1629  MCV 87 09/16/2017 1629   MCH 29.9 09/16/2017 1629   MCH 30.5 05/01/2014 1426   MCHC 34.3 09/16/2017 1629   MCHC 34.7 05/01/2014 1426   RDW 14.4 09/16/2017 1629   LYMPHSABS 2.0 04/27/2007 2302   MONOABS 0.5 04/27/2007 2302   EOSABS 0.0 04/27/2007 2302   BASOSABS 0.0 04/27/2007 2302    CMP     Component Value Date/Time   NA 143 05/06/2022 1459   K 4.2 05/06/2022 1459   CL 103 05/06/2022 1459   CO2 21 05/06/2022 1459   GLUCOSE 92 05/06/2022 1459   GLUCOSE 85 05/01/2014 1426   BUN 10 05/06/2022 1459   CREATININE 0.74 (L) 05/06/2022 1459   CALCIUM 9.9 05/06/2022 1459   PROT 7.3 05/06/2022 1459   ALBUMIN 5.1 05/06/2022 1459   AST 20 05/06/2022 1459   ALT 20 05/06/2022 1459   ALKPHOS 77 05/06/2022 1459    BILITOT 0.4 05/06/2022 1459   GFRNONAA 120 09/16/2017 1629   GFRAA 138 09/16/2017 1629     ASSESSMENT AND PLAN:  42 year old male with family history of colon cancer (mother, 64) due for initial high risk screening colonoscopy.  He also has frequent GERD symptoms with occasional solid dysphagia. Will schedule for EGD to assess for peptic sticture/Schaztki ring and dilate if indicated.  Will hold off on starting any PPI until after endoscopic assessment.  Family history of colon cancer - Colonoscopy - Patient aware that is recommended to undergo colonoscopy every 5 years due to family history  Dysphagia/frequent GERD symptoms - EGD with possible dilation  The details, risks (including bleeding, perforation, infection, missed lesions, medication reactions and possible hospitalization or surgery if complications occur), benefits, and alternatives to EGD/colonoscopy with possible biopsy, dilation and polypectomy were discussed with the patient and he consents to proceed.   Leoni Goodness E. Candis Schatz, MD [facility]   CC:  Leeanne Rio, MD

## 2022-12-24 ENCOUNTER — Encounter: Payer: Medicaid Other | Admitting: Gastroenterology

## 2022-12-31 ENCOUNTER — Telehealth: Payer: Self-pay | Admitting: *Deleted

## 2022-12-31 NOTE — Telephone Encounter (Signed)
Attempted to call x 2 message left with call back # to return call to reschedule pre-visit or procedure on 02/01/23 will be canceled.

## 2022-12-31 NOTE — Telephone Encounter (Signed)
Pre-visit rescheduled for 01/14/23 @1430 .

## 2023-01-14 ENCOUNTER — Telehealth: Payer: Self-pay

## 2023-01-14 NOTE — Telephone Encounter (Signed)
Unable to reach patient for PV. VM is not set up to leave a message. If the pt calls back please have his PV r/s, otherwise his PV and colon will be cancelled and hell be notified via no show letter.

## 2023-02-01 ENCOUNTER — Encounter: Payer: Medicaid Other | Admitting: Gastroenterology

## 2023-05-12 ENCOUNTER — Ambulatory Visit (INDEPENDENT_AMBULATORY_CARE_PROVIDER_SITE_OTHER): Payer: Medicaid Other | Admitting: Student

## 2023-05-12 ENCOUNTER — Telehealth: Payer: Self-pay

## 2023-05-12 VITALS — BP 136/77 | HR 81 | Ht 65.0 in | Wt 165.4 lb

## 2023-05-12 DIAGNOSIS — Z1211 Encounter for screening for malignant neoplasm of colon: Secondary | ICD-10-CM

## 2023-05-12 NOTE — Progress Notes (Signed)
Needs new referral to gastro

## 2023-05-12 NOTE — Patient Instructions (Signed)
Damon Mckee,  It was good seeing you in clinic today! You came in today for an area of numbness of your left face along with a small rash in that region.   There are a few things for you to keep an eye on: If this rash spreads or becomes painful, please call or return to this clinic!  If you also start to experience weakness on one side of your body, please call or return to this clinic. Please also call the GI doctors to set up a colonoscopy.  There is nothing to be done at today's visit about this area of numbness or small rash; they will hopefully get better with time.  Thank you for allowing [place] to care for you! [name], medical student Dr. Dorothyann Gibbs, PGY3

## 2023-05-12 NOTE — Telephone Encounter (Signed)
Patient LVM on nurse line due to waking up with left sided numbness in forehead.   Returned call to patient. He reports that he is able to see a bug/insect bite around temple area.   Denies facial drooping, arm weakness, radiation of numbness, balance issues or speech changes/difficulties.  No difficulty breathing.   Spoke with Dr. Linwood Dibbles regarding patient. Recommended same day appointment. Scheduled in ATC triage spot for this afternoon.   ED precautions discussed in the meantime.   Veronda Prude, RN

## 2023-05-12 NOTE — Progress Notes (Deleted)
error 

## 2023-05-12 NOTE — Progress Notes (Signed)
    SUBJECTIVE:   CHIEF COMPLAINT / HPI:   Damon Mckee is a 42 y.o. male who presents today for L sided facial numbness.  Pt reports he woke up this morning with feeling of numbness in his L temple region. Also reports his girlfriend noticed a new "bug bite" in the area. No pain, no injury to region. Denies changes in motor functions of face. No other areas of numbness in rest of body. No fever, chills. Reports always vomits in the morning with mucus, no change in this baseline. Reports baseline nausea, no change. Reports diarrhea at baseline, no change. No CP, SOB.   Pt also reports he has not yet been able to get his colonoscopy; he never got a call to schedule this back in the spring, and so was listed as a No Show in the GI practice.  PERTINENT  PMH / PSH: HTN, GERD  OBJECTIVE:   BP 136/77 (BP Location: Left Arm, Patient Position: Sitting, Cuff Size: Normal)   Pulse 81   Ht 5\' 5"  (1.651 m)   Wt 165 lb 6.4 oz (75 kg)   SpO2 100%   BMI 27.52 kg/m   General: Pt is sitting in chair, no acute distress. Cardiovascular: RRR, no murmurs, rubs, gallops. Pulmonary: Normal work of breathing. Lungs clear to auscultation bilaterally. Skin: Small pink area in L temple region (see image); not painful to palpation. Neuro: Alert and oriented to person, place, event. Normal affect. Outside of numbness reported in left temple/eyebrow region, Cns II-XII grossly intact.     ASSESSMENT/PLAN:   L Facial Numbness Pt with area of numbness of L temple region with small area of rash. No motor deficits, no gross CN deficits. No concern at this time for stroke, Bells Palsy. Could be initial manifestation of herpes zoster ophthalmicus though less likely given parasthesia without real pain at this point; pt cautioned on return precautions of rash spreads or becomes more painful. More likely that area is a result of bug bite or sleeping on side of face. No further interventions indicated at this  time. - Return precautions given if rash spreads or becomes painful - No further workup indicated at this time.  Health Care Maintenance Will place repeat referral to GI for pt to get screening colonoscopy.  [name], Medical Student [facility]    I have evaluated this patient along with Medical Student [name] and reviewed the above note, making necessary revisions.  Dorothyann Gibbs, MD 05/13/2023, 1:28 PM PGY-3, [facility]

## 2023-07-11 ENCOUNTER — Ambulatory Visit (INDEPENDENT_AMBULATORY_CARE_PROVIDER_SITE_OTHER): Payer: Medicaid Other | Admitting: Student

## 2023-07-11 VITALS — BP 160/100 | HR 74 | Ht 65.0 in | Wt 165.2 lb

## 2023-07-11 DIAGNOSIS — R0789 Other chest pain: Secondary | ICD-10-CM

## 2023-07-11 DIAGNOSIS — M25512 Pain in left shoulder: Secondary | ICD-10-CM | POA: Diagnosis present

## 2023-07-11 DIAGNOSIS — I10 Essential (primary) hypertension: Secondary | ICD-10-CM | POA: Diagnosis not present

## 2023-07-11 DIAGNOSIS — K219 Gastro-esophageal reflux disease without esophagitis: Secondary | ICD-10-CM | POA: Diagnosis not present

## 2023-07-11 MED ORDER — OMEPRAZOLE 40 MG PO CPDR: 40.0000 mg | DELAYED_RELEASE_CAPSULE | Freq: Every day | ORAL | 3 refills | Status: AC

## 2023-07-11 MED ORDER — MELOXICAM 15 MG PO TABS: 15.0000 mg | ORAL_TABLET | Freq: Every day | ORAL | 0 refills | Status: DC

## 2023-07-11 NOTE — Progress Notes (Signed)
    SUBJECTIVE:   CHIEF COMPLAINT / HPI:   Left shoulder pain Left shoulder pain started after wrestling with his cousin for fun 2 weeks ago.  Additionally having some left sided chest pain, primarily sternal.  No dyspnea, exertional dyspnea, chest tightness, SOB, nausea, vomiting, cough, fevers.  Pain has been constant since this injury.  Taking ibuprofen intermittently, provides some benefit.  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  GERD symptoms Reports increased mucus in his throat, worse after lying down.  Has symptoms of heartburn.  Currently drinking daily and smoking daily.  He was unable to quantify alcohol use and tobacco use.   Hypertension Has not been taking his blood pressure medications.  OBJECTIVE:   BP (!) 160/100   Pulse 74   Ht 5\' 5"  (1.651 m)   Wt 165 lb 4 oz (75 kg)   SpO2 100%   BMI 27.50 kg/m    General: NAD, pleasant HEENT: Normocephalic, atraumatic head.  ROM intact, slightly sluggish.  Normal conjunctiva. Cardio: RRR, no MRG. Chest: TTP over left sternal border ribs 3-5, additional TTP to lateral shaft of left ribs 3-5. Respiratory: CTAB, normal wob on RA GI: Abdomen is soft, not tender, not distended. BS present MSK:  Left shoulder: No gross deformity, no ecchymosis, no swelling.  TTP over medial scapula.  Mild scapular winging on left compared to right noted.  F ROM.  5/5 strength and normal sensation. Negative empty can Negative Hawkins Negative O'Brien's Negative Yergason  Skin: Warm and dry   ASSESSMENT/PLAN:   Assessment & Plan Acute pain of left shoulder Acute left shoulder pain 2/2 injury sustained from wrestling.  Differential includes: Scapular dyskinesia, muscle strain, rotator cuff injury. - Meloxicam 50 mg daily - Shoulder exercises demonstrated and provided today - If not improving consider imaging v.  Formal physical therapy Other chest pain Point tenderness over left sternal  border and lateral shaft of the ribs 3-5 on left. Differential includes: Costochondritis, contusion, stress fracture. - Management as above Gastroesophageal reflux disease, unspecified whether esophagitis present Uncontrolled GERD symptoms.  Worsened by alcohol and tobacco use. - Omeprazole - Counseled on alcohol and tobacco cessation, patient remains precontemplative Primary hypertension Patient noted to have elevated blood pressure during this office visit.  Previously controlled with amlodipine 5 mg.  He admits to not taking his medication. - Restart amlodipine 5 mg daily - Follow-up scheduled in 2 weeks with PCP  Follow-up recommendations Recommend scheduling patient for annual visit Recheck blood pressure, suspect he may need 2 medication regimen Consider imaging versus formal PT if shoulder not improved   Tiffany Kocher, DO [facility]

## 2023-07-11 NOTE — Assessment & Plan Note (Signed)
Patient noted to have elevated blood pressure during this office visit.  Previously controlled with amlodipine 5 mg.  He admits to not taking his medication. - Restart amlodipine 5 mg daily - Follow-up scheduled in 2 weeks with PCP

## 2023-07-11 NOTE — Patient Instructions (Signed)
It was great to see you! Thank you for allowing me to participate in your care!   I recommend that you always bring your medications to each appointment as this makes it easy to ensure we are on the correct medications and helps [place] not miss when refills are needed.  Our plans for today:  -Please take meloxicam 15 mg daily for 7 days -Please perform the exercises attached to this paperwork -Please take omeprazole 40 mg daily to help with GERD -Your blood pressure is elevated, please follow-up in 2 weeks.  Please take your amlodipine.  Take care and seek immediate care sooner if you develop any concerns. Please remember to show up 15 minutes before your scheduled appointment time!  Tiffany Kocher, DO [facility]

## 2023-07-11 NOTE — Assessment & Plan Note (Signed)
Uncontrolled GERD symptoms.  Worsened by alcohol and tobacco use. - Omeprazole - Counseled on alcohol and tobacco cessation, patient remains precontemplative

## 2023-07-25 ENCOUNTER — Ambulatory Visit (INDEPENDENT_AMBULATORY_CARE_PROVIDER_SITE_OTHER): Payer: Medicaid Other | Admitting: Family Medicine

## 2023-07-25 ENCOUNTER — Encounter: Payer: Self-pay | Admitting: Family Medicine

## 2023-07-25 VITALS — BP 163/97 | HR 82 | Ht 65.0 in | Wt 165.4 lb

## 2023-07-25 DIAGNOSIS — I10 Essential (primary) hypertension: Secondary | ICD-10-CM | POA: Diagnosis present

## 2023-07-25 DIAGNOSIS — M940 Chondrocostal junction syndrome [Tietze]: Secondary | ICD-10-CM

## 2023-07-25 MED ORDER — OMRON 10 SERIES BP MONITOR DEVI: 0 refills | Status: AC

## 2023-07-25 MED ORDER — MELOXICAM 15 MG PO TABS: 15.0000 mg | ORAL_TABLET | Freq: Every day | ORAL | 0 refills | Status: AC

## 2023-07-25 NOTE — Patient Instructions (Addendum)
Please visit [facility] to see if you can get your blood pressure cuff . Please take your amlodipine and keep track of your BP at home  I have sent a refill of meloxicam  Blood Pressure Record Sheet To take your blood pressure, you will need a blood pressure machine. You can buy a blood pressure machine (blood pressure monitor) at your clinic, drug store, or online. When choosing one, consider: An automatic monitor that has an arm cuff. A cuff that wraps snugly around your upper arm. You should be able to fit only one finger between your arm and the cuff. A device that stores blood pressure reading results. Do not choose a monitor that measures your blood pressure from your wrist or finger. Follow your health care provider's instructions for how to take your blood pressure. To use this form: Take your blood pressure medications every day These measurements should be taken when you have been at rest for at least 10-15 min Take at least 2 readings with each blood pressure check. This makes sure the results are correct. Wait 1-2 minutes between measurements. Write down the results in the spaces on this form. Keep in mind it should always be recorded systolic over diastolic. Both numbers are important.  Repeat this every day for 2-3 weeks, or as told by your health care provider.  Make a follow-up appointment with your health care provider to discuss the results.  Blood Pressure Log Date Medications taken? (Y/N) Blood Pressure Time of Day

## 2023-07-25 NOTE — Assessment & Plan Note (Signed)
Elevated BP in clinic today although patient has not taken his amlodipine in 2 days.  Advised him to restart this.  Sent prescription for blood pressure monitor to [facility].  Provided blood pressure log.  Advised patient to keep track of BP at home and follow-up closely to monitor his BP

## 2023-07-25 NOTE — Progress Notes (Signed)
    SUBJECTIVE:   CHIEF COMPLAINT / HPI:   Follow-up for hypertension and MSK shoulder/rib pain Seen 11/11, prescribed meloxicam 15 mg daily and provided shoulder exercises Also had elevated BP during that visit but was not taking his amlodipine 5 mg daily  Today: Reports improvement in his pain overall with meloxicam, though he misplaced his medication after couple days of use and requests refill on this  Hypertension: Did not take meds today Denies headaches, vision changes, chest pain, shortness of breath  PERTINENT  PMH / PSH: Hypertension  OBJECTIVE:   BP (!) 163/97   Pulse 82   Ht 5\' 5"  (1.651 m)   Wt 165 lb 6.4 oz (75 kg)   SpO2 99%   BMI 27.52 kg/m   General: NAD, pleasant, able to participate in exam Cardiac: RRR, no murmurs auscultated, chest wall tender to palpation Respiratory: CTAB, normal WOB Abdomen: soft, non-tender, non-distended, normoactive bowel sounds Extremities: warm and well perfused, no edema or cyanosis Skin: warm and dry, no rashes noted Neuro: alert, no obvious focal deficits, speech normal Psych: Normal affect and mood  ASSESSMENT/PLAN:   Assessment & Plan Primary hypertension Elevated BP in clinic today although patient has not taken his amlodipine in 2 days.  Advised him to restart this.  Sent prescription for blood pressure monitor to [facility].  Provided blood pressure log.  Advised patient to keep track of BP at home and follow-up closely to monitor his BP Costochondritis Stable, improving, refilled meloxicam and discussed supportive care   Vonna Drafts, MD [facility]

## 2023-07-26 DIAGNOSIS — I10 Essential (primary) hypertension: Secondary | ICD-10-CM | POA: Diagnosis not present

## 2023-08-03 ENCOUNTER — Telehealth: Payer: Self-pay

## 2023-08-03 DIAGNOSIS — M940 Chondrocostal junction syndrome [Tietze]: Secondary | ICD-10-CM

## 2023-08-03 NOTE — Telephone Encounter (Signed)
Patient calls nurse line reporting continued costochondritis pain.  He reports he has been taking the meloxicam as prescribed and this is helping, however the pain has spread to his left side.   He denies any trauma to the area. He denies any difficulty breathing.   He reports he does not want to continue taking "medication" for symptom management. He reports he has been trying the conservative measures given at last visit with no relief.   Patient scheduled for followup apt for next week.   He is requesting any additional recommendation between now and then.   Will forward to provider who saw patient/PCP.

## 2023-08-09 ENCOUNTER — Ambulatory Visit: Payer: Medicaid Other | Admitting: Student

## 2023-08-09 ENCOUNTER — Ambulatory Visit: Payer: Medicaid Other

## 2023-08-09 VITALS — BP 153/99 | HR 87 | Ht 65.0 in | Wt 164.0 lb

## 2023-08-09 DIAGNOSIS — I10 Essential (primary) hypertension: Secondary | ICD-10-CM | POA: Diagnosis not present

## 2023-08-09 DIAGNOSIS — M940 Chondrocostal junction syndrome [Tietze]: Secondary | ICD-10-CM

## 2023-08-09 MED ORDER — DICLOFENAC SODIUM 1 % EX GEL: 2.0000 g | Freq: Four times a day (QID) | CUTANEOUS | 1 refills | Status: AC | PRN

## 2023-08-09 NOTE — Patient Instructions (Addendum)
Please review attached info  I have prescribed voltaren gel which you can apply as prescribed to your ribs in the painful area  Keep track of your BP and start taking your amlodipine regularly

## 2023-08-09 NOTE — Assessment & Plan Note (Addendum)
Elevated BP today, has not been taking amlodipine regularly. Also smoked today before clinic. Advised regular use of amlodipine, home BP log, and f/u in 2 weeks. No phlebotomist in clinic today, obtain labs at next visit

## 2023-08-09 NOTE — Progress Notes (Addendum)
    SUBJECTIVE:   CHIEF COMPLAINT / HPI:   Seen 11/25 for MSK pain thought to be costochondritis Symptoms ongoing >55mo Since then has been taking meloxicam which has helped temporarily but continues to have symptoms. He prefers not to take medication in general but would be interested in a cream/gel Smoking, does not wish to stop right now  Elevated BP Has not been taking amlodipine regularly but states this usually keeps him under good control Also smoked right before clinic today Denies significant HA, vision changes, SOB  PERTINENT  PMH / PSH: HTN  OBJECTIVE:   BP (!) 153/99   Pulse 87   Ht 5\' 5"  (1.651 m)   Wt 164 lb (74.4 kg)   SpO2 100%   BMI 27.29 kg/m    General: NAD, pleasant, able to participate in exam Cardiac: RRR, no murmurs auscultated. Chest wall tenderness to palpation bilaterally at lower anterolateral ribs Respiratory: CTAB, normal WOB Abdomen: soft, non-tender, non-distended, normoactive bowel sounds Extremities: warm and well perfused, no edema or cyanosis Skin: warm and dry, no rashes noted Neuro: alert, no obvious focal deficits, speech normal Psych: Normal affect and mood  ASSESSMENT/PLAN:   Assessment & Plan Costochondritis Improved briefly with meloxicam, but pt wishes to decrease pill burden. Amenable to try Voltaren gel. PT referral was also placed but I provided a handout with additional details today as well. Discussed return precautions Primary hypertension Elevated BP today, has not been taking amlodipine regularly. Also smoked today before clinic. Advised regular use of amlodipine, home BP log, and f/u in 2 weeks. No phlebotomist in clinic today, obtain labs at next visit   Vonna Drafts, MD [facility]

## 2023-09-05 ENCOUNTER — Other Ambulatory Visit: Payer: Self-pay

## 2023-09-05 MED ORDER — AMLODIPINE BESYLATE 5 MG PO TABS: 5.0000 mg | ORAL_TABLET | Freq: Every day | ORAL | 3 refills | Status: AC

## 2023-10-05 ENCOUNTER — Encounter (HOSPITAL_BASED_OUTPATIENT_CLINIC_OR_DEPARTMENT_OTHER): Payer: Self-pay | Admitting: Emergency Medicine

## 2023-10-05 ENCOUNTER — Emergency Department (HOSPITAL_BASED_OUTPATIENT_CLINIC_OR_DEPARTMENT_OTHER): Payer: Medicaid Other | Admitting: Radiology

## 2023-10-05 DIAGNOSIS — R0781 Pleurodynia: Secondary | ICD-10-CM | POA: Insufficient documentation

## 2023-10-05 DIAGNOSIS — Z79899 Other long term (current) drug therapy: Secondary | ICD-10-CM | POA: Diagnosis not present

## 2023-10-05 DIAGNOSIS — I10 Essential (primary) hypertension: Secondary | ICD-10-CM | POA: Diagnosis not present

## 2023-10-05 DIAGNOSIS — R0602 Shortness of breath: Secondary | ICD-10-CM | POA: Insufficient documentation

## 2023-10-05 DIAGNOSIS — R079 Chest pain, unspecified: Secondary | ICD-10-CM | POA: Diagnosis not present

## 2023-10-05 LAB — CBC WITH DIFFERENTIAL/PLATELET
Abs Immature Granulocytes: 0.05 10*3/uL (ref 0.00–0.07)
Basophils Absolute: 0 10*3/uL (ref 0.0–0.1)
Basophils Relative: 1 %
Eosinophils Absolute: 0.1 10*3/uL (ref 0.0–0.5)
Eosinophils Relative: 1 %
HCT: 44.5 % (ref 39.0–52.0)
Hemoglobin: 14.8 g/dL (ref 13.0–17.0)
Immature Granulocytes: 1 %
Lymphocytes Relative: 34 %
Lymphs Abs: 2.6 10*3/uL (ref 0.7–4.0)
MCH: 30.8 pg (ref 26.0–34.0)
MCHC: 33.3 g/dL (ref 30.0–36.0)
MCV: 92.7 fL (ref 80.0–100.0)
Monocytes Absolute: 0.8 10*3/uL (ref 0.1–1.0)
Monocytes Relative: 10 %
Neutro Abs: 4 10*3/uL (ref 1.7–7.7)
Neutrophils Relative %: 53 %
Platelets: 276 10*3/uL (ref 150–400)
RBC: 4.8 MIL/uL (ref 4.22–5.81)
RDW: 13.1 % (ref 11.5–15.5)
WBC: 7.5 10*3/uL (ref 4.0–10.5)
nRBC: 0 % (ref 0.0–0.2)

## 2023-10-05 LAB — BASIC METABOLIC PANEL
Anion gap: 11 (ref 5–15)
BUN: 7 mg/dL (ref 6–20)
CO2: 29 mmol/L (ref 22–32)
Calcium: 9.6 mg/dL (ref 8.9–10.3)
Chloride: 102 mmol/L (ref 98–111)
Creatinine, Ser: 0.71 mg/dL (ref 0.61–1.24)
GFR, Estimated: >60 mL/min (ref >60)
Glucose, Bld: 80 mg/dL (ref 70–99)
Potassium: 3.2 mmol/L — ABNORMAL LOW (ref 3.5–5.1)
Sodium: 142 mmol/L (ref 135–145)

## 2023-10-05 LAB — TROPONIN I (HIGH SENSITIVITY): Troponin I (High Sensitivity): 4 ng/L (ref <18)

## 2023-10-05 LAB — BRAIN NATRIURETIC PEPTIDE: B Natriuretic Peptide: 18.1 pg/mL (ref 0.0–100.0)
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
## 2023-10-06 ENCOUNTER — Emergency Department (HOSPITAL_BASED_OUTPATIENT_CLINIC_OR_DEPARTMENT_OTHER)
Admission: EM | Admit: 2023-10-06 | Discharge: 2023-10-06 | Disposition: A | Discharge status: Home / Self Care | Payer: Medicaid Other | Attending: Emergency Medicine | Admitting: Emergency Medicine

## 2023-10-06 ENCOUNTER — Emergency Department (HOSPITAL_BASED_OUTPATIENT_CLINIC_OR_DEPARTMENT_OTHER): Payer: Medicaid Other | Admitting: Radiology

## 2023-10-06 DIAGNOSIS — R0781 Pleurodynia: Secondary | ICD-10-CM | POA: Diagnosis not present

## 2023-10-06 LAB — TROPONIN I (HIGH SENSITIVITY): Troponin I (High Sensitivity): 4 ng/L (ref <18)

## 2023-10-06 LAB — D-DIMER, QUANTITATIVE: D-Dimer, Quant: 0.28 ug{FEU}/mL (ref 0.00–0.50)

## 2023-10-06 MED ORDER — KETOROLAC TROMETHAMINE 30 MG/ML IJ SOLN
30.0000 mg | Freq: Once | INTRAMUSCULAR | Status: AC
  Administered 2023-10-06: 30 mg via INTRAMUSCULAR
  Filled 2023-10-06: qty 1

## 2023-10-06 MED ORDER — NAPROXEN 500 MG PO TABS: 500.0000 mg | ORAL_TABLET | Freq: Two times a day (BID) | ORAL | 0 refills | Status: AC | PRN
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
## 2023-10-06 NOTE — Discharge Instructions (Addendum)
 Testing is negative for heart attack or blood clot in the lung.  Take the anti-inflammatory medication as prescribed.  No fracture seen.  Follow-up with your doctor.  Return to the ED with new or worsening symptoms

## 2023-10-06 NOTE — ED Provider Notes (Signed)
 [place] EMERGENCY DEPARTMENT AT [facility] Provider Note   CSN: [id number] Arrival date & time: 10/05/23  1721     History  Chief Complaint  Patient presents with   Chest Pain    Damon Mckee is a 43 y.o. male.  Patient reports left rib pain for the past 10 days.  Became worse today after coughing and laughing.  He reports he was pulled to the ground by the police about 10 days ago and injured his ribs at that time.  Has been hurting him consistently since but became worse today after coughing and laughing.  Did not take any pain medicine at home.  Pain with breathing.  Denies cough or fever.  Pain is constant.  No radiation of the pain.  No abdominal pain, nausea, vomiting or fever.  Denies any cardiac history.  History of hypertension.  Does not use any blood thinners.  No history of CAD or CHF.  No history of PE or DVT.  The history is provided by the patient.  Chest Pain Associated symptoms: shortness of breath   Associated symptoms: no abdominal pain, no dizziness, no fever, no headache, no nausea, no vomiting and no weakness        Home Medications Prior to Admission medications   Medication Sig Start Date End Date Taking? Authorizing Provider  amLODipine  (NORVASC ) 5 MG tablet Take 1 tablet (5 mg total) by mouth at bedtime. 09/05/23   Romelle Booty, MD  Blood Pressure Monitoring (OMRON 10 SERIES BP MONITOR) DEVI Please monitor blood pressure daily 07/25/23   Romelle Booty, MD  diclofenac  Sodium (VOLTAREN ) 1 % GEL Apply 2 g topically 4 (four) times daily as needed (pain). For rib pain 08/09/23   Romelle Booty, MD  meloxicam  (MOBIC ) 15 MG tablet Take 1 tablet (15 mg total) by mouth daily. 07/25/23   Romelle Booty, MD  omeprazole  (PRILOSEC) 40 MG capsule Take 1 capsule (40 mg total) by mouth daily. 07/11/23   Howell Lunger, DO      Allergies    Patient has no known allergies.    Review of Systems   Review of Systems  Constitutional:  Negative for  activity change, appetite change and fever.  HENT:  Negative for congestion.   Respiratory:  Positive for chest tightness and shortness of breath.   Cardiovascular:  Positive for chest pain.  Gastrointestinal:  Negative for abdominal pain, nausea and vomiting.  Genitourinary:  Negative for dysuria and hematuria.  Musculoskeletal:  Negative for arthralgias, joint swelling and myalgias.  Skin:  Negative for rash.  Neurological:  Negative for dizziness, weakness and headaches.   all other systems are negative except as noted in the HPI and PMH.    Physical Exam Updated Vital Signs BP (!) 145/91 (BP Location: Right Arm)   Pulse 66   Temp (!) 97.2 F (36.2 C) (Temporal)   Resp 19   SpO2 99%  Physical Exam Vitals and nursing note reviewed.  Constitutional:      General: He is not in acute distress.    Appearance: He is well-developed.  HENT:     Head: Normocephalic and atraumatic.     Mouth/Throat:     Pharynx: No oropharyngeal exudate.  Eyes:     Conjunctiva/sclera: Conjunctivae normal.     Pupils: Pupils are equal, round, and reactive to light.  Neck:     Comments: No meningismus. Cardiovascular:     Rate and Rhythm: Normal rate and regular rhythm.     Heart  sounds: Normal heart sounds. No murmur heard. Pulmonary:     Effort: Pulmonary effort is normal. No respiratory distress.     Breath sounds: Normal breath sounds.     Comments: Equal breath sounds, reproducible left rib pain No crepitus Chest:     Chest wall: Tenderness present.  Abdominal:     Palpations: Abdomen is soft.     Tenderness: There is no abdominal tenderness. There is no guarding or rebound.  Musculoskeletal:        General: No tenderness. Normal range of motion.     Cervical back: Normal range of motion and neck supple.  Skin:    General: Skin is warm.  Neurological:     Mental Status: He is alert and oriented to person, place, and time.     Cranial Nerves: No cranial nerve deficit.     Motor: No  abnormal muscle tone.     Coordination: Coordination normal.     Comments:  5/5 strength throughout. CN 2-12 intact.Equal grip strength.   Psychiatric:        Behavior: Behavior normal.     ED Results / Procedures / Treatments   Labs (all labs ordered are listed, but only abnormal results are displayed) Labs Reviewed  BASIC METABOLIC PANEL - Abnormal; Notable for the following components:      Result Value   Potassium 3.2 (*)    All other components within normal limits  BRAIN NATRIURETIC PEPTIDE  CBC WITH DIFFERENTIAL/PLATELET  D-DIMER, QUANTITATIVE  TROPONIN I (HIGH SENSITIVITY)  TROPONIN I (HIGH SENSITIVITY)    EKG EKG Interpretation Date/Time:  Wednesday October 05 2023 17:34:53 EST Ventricular Rate:  70 PR Interval:  138 QRS Duration:  88 QT Interval:  356 QTC Calculation: 384 R Axis:   198  Text Interpretation: Normal sinus rhythm Right superior axis deviation Pulmonary disease pattern Right ventricular hypertrophy Abnormal ECG When compared with ECG of 01-May-2014 14:21, No significant change was found No significant change was found Confirmed by Carita Senior [id number]) on 10/06/2023 1:15:42 AM  Radiology DG Ribs Unilateral W/Chest Left Result Date: 10/06/2023 CLINICAL DATA:  Rib pain EXAM: LEFT RIBS AND CHEST - 3+ VIEW COMPARISON:  10/05/2023 FINDINGS: No fracture or other bone lesions are seen involving the ribs. There is no evidence of pneumothorax or pleural effusion. Both lungs are clear. Heart size and mediastinal contours are within normal limits. IMPRESSION: Negative. Electronically Signed   By: Dorethia Molt M.D.   On: 10/06/2023 02:06   DG Chest 2 View Result Date: 10/05/2023 CLINICAL DATA:  Chest pain EXAM: CHEST - 2 VIEW COMPARISON:  Chest x-ray 10/08/2015 FINDINGS: The heart size and mediastinal contours are within normal limits. Both lungs are clear. The visualized skeletal structures are unremarkable. IMPRESSION: No active cardiopulmonary disease.  Electronically Signed   By: Greig Pique M.D.   On: 10/05/2023 19:58    Procedures Procedures    Medications Ordered in ED Medications  ketorolac  (TORADOL ) 30 MG/ML injection 30 mg (has no administration in time range)    ED Course/ Medical Decision Making/ A&P                                 Medical Decision Making Amount and/or Complexity of Data Reviewed Labs: ordered. Decision-making details documented in ED Course. Radiology: ordered and independent interpretation performed. Decision-making details documented in ED Course. ECG/medicine tests: ordered and independent interpretation performed. Decision-making details documented in ED Course.  Risk Prescription drug management.   Left rib pain x 10 days, worse today after coughing and laughing.  No hypoxia.  Equal breath sounds.  EKG is nonischemic.  Low suspicion for ACS or PE.  X-ray obtained in triage shows no rib fracture or pneumothorax.  Results reviewed and interpreted by me  Troponin  negative x 2.  D-dimer negative.  Low suspicion for ACS or PE.  Will treat supportively for suspected rib contusion or possible occult fracture.  No pneumothorax.  Patient sleeping on reassessment.  Pain is controlled.  No hypoxia or increased work of breathing.  Equal breath sounds.  Treat supportively with anti-inflammatories and PCP follow-up.  Return precautions discussed.       Final Clinical Impression(s) / ED Diagnoses Final diagnoses:  None    Rx / DC Orders ED Discharge Orders     None         Lajeana Strough, Garnette, MD 10/06/23 [phone]

## 2023-11-28 NOTE — Telephone Encounter (Signed)
 Marland Kitchen
